# Patient Record
Sex: Male | Born: 1975
Health system: Southern US, Community
[De-identification: ages and names within clinical notes are randomized; demographics above are authoritative.]

## PROBLEM LIST (undated history)

## (undated) DIAGNOSIS — T7840XA Allergy, unspecified, initial encounter: Secondary | ICD-10-CM

## (undated) DIAGNOSIS — B019 Varicella without complication: Secondary | ICD-10-CM

## (undated) DIAGNOSIS — K219 Gastro-esophageal reflux disease without esophagitis: Secondary | ICD-10-CM

## (undated) DIAGNOSIS — K5130 Ulcerative (chronic) rectosigmoiditis without complications: Secondary | ICD-10-CM

## (undated) DIAGNOSIS — R112 Nausea with vomiting, unspecified: Principal | ICD-10-CM

## (undated) DIAGNOSIS — K5190 Ulcerative colitis, unspecified, without complications: Secondary | ICD-10-CM

## (undated) HISTORY — DX: Ulcerative colitis, unspecified, without complications: K51.90

## (undated) HISTORY — PX: COLONOSCOPY W/ BIOPSIES: SHX1374

## (undated) HISTORY — DX: Nausea with vomiting, unspecified: R11.2

## (undated) HISTORY — DX: Allergy, unspecified, initial encounter: T78.40XA

## (undated) HISTORY — DX: Gastro-esophageal reflux disease without esophagitis: K21.9

## (undated) HISTORY — DX: Ulcerative (chronic) rectosigmoiditis without complications: K51.30

## (undated) HISTORY — DX: Varicella without complication: B01.9

## (undated) HISTORY — PX: ESOPHAGOGASTRODUODENOSCOPY: SHX1529

---

## 2003-10-22 DIAGNOSIS — K5130 Ulcerative (chronic) rectosigmoiditis without complications: Secondary | ICD-10-CM | POA: Insufficient documentation

## 2009-02-26 ENCOUNTER — Encounter: Admission: RE | Admit: 2009-02-26 | Discharge: 2009-02-26 | Payer: Self-pay | Admitting: *Deleted

## 2010-08-03 ENCOUNTER — Emergency Department (HOSPITAL_COMMUNITY): Payer: BC Managed Care – PPO

## 2010-08-03 ENCOUNTER — Emergency Department (HOSPITAL_COMMUNITY)
Admission: EM | Admit: 2010-08-03 | Discharge: 2010-08-03 | Disposition: A | Discharge status: Home / Self Care | Payer: BC Managed Care – PPO | Attending: Emergency Medicine | Admitting: Emergency Medicine

## 2010-08-03 DIAGNOSIS — M542 Cervicalgia: Secondary | ICD-10-CM | POA: Insufficient documentation

## 2010-08-03 DIAGNOSIS — R079 Chest pain, unspecified: Secondary | ICD-10-CM | POA: Insufficient documentation

## 2010-08-03 LAB — D-DIMER, QUANTITATIVE

## 2010-08-03 LAB — DIFFERENTIAL
Basophils Absolute: 0 10*3/uL (ref 0.0–0.1)
Eosinophils Absolute: 0.1 10*3/uL (ref 0.0–0.7)
Lymphocytes Relative: 35 % (ref 12–46)
Lymphs Abs: 2.9 10*3/uL (ref 0.7–4.0)
Neutrophils Relative %: 56 % (ref 43–77)

## 2010-08-03 LAB — CBC
HCT: 44.9 % (ref 39.0–52.0)
MCV: 87.4 fL (ref 78.0–100.0)
Platelets: 207 10*3/uL (ref 150–400)
RBC: 5.14 MIL/uL (ref 4.22–5.81)
RDW: 12 % (ref 11.5–15.5)
WBC: 8.4 10*3/uL (ref 4.0–10.5)

## 2010-08-03 LAB — BASIC METABOLIC PANEL
CO2: 30 mEq/L (ref 19–32)
Chloride: 100 mEq/L (ref 96–112)
Creatinine, Ser: 0.88 mg/dL (ref 0.4–1.5)
Potassium: 3.7 mEq/L (ref 3.5–5.1)

## 2010-08-03 LAB — CK TOTAL AND CKMB (NOT AT ARMC)
CK, MB: 0.9 ng/mL (ref 0.3–4.0)
Relative Index: 0.9 (ref 0.0–2.5)
Total CK: 104 U/L (ref 7–232)

## 2010-08-03 LAB — TROPONIN I

## 2010-10-22 ENCOUNTER — Ambulatory Visit (INDEPENDENT_AMBULATORY_CARE_PROVIDER_SITE_OTHER): Payer: BC Managed Care – PPO | Admitting: Family Medicine

## 2010-10-22 ENCOUNTER — Encounter: Payer: Self-pay | Admitting: Family Medicine

## 2010-10-22 DIAGNOSIS — K219 Gastro-esophageal reflux disease without esophagitis: Secondary | ICD-10-CM

## 2010-10-22 DIAGNOSIS — L678 Other hair color and hair shaft abnormalities: Secondary | ICD-10-CM

## 2010-10-22 DIAGNOSIS — L738 Other specified follicular disorders: Secondary | ICD-10-CM

## 2010-10-22 DIAGNOSIS — K5190 Ulcerative colitis, unspecified, without complications: Secondary | ICD-10-CM

## 2010-10-22 DIAGNOSIS — L739 Follicular disorder, unspecified: Secondary | ICD-10-CM

## 2010-10-22 MED ORDER — MESALAMINE 400 MG PO TBEC: DELAYED_RELEASE_TABLET | ORAL | Status: DC

## 2010-10-22 MED ORDER — CEPHALEXIN 500 MG PO CAPS: 500.0000 mg | ORAL_CAPSULE | Freq: Three times a day (TID) | ORAL | Status: AC

## 2010-10-22 NOTE — Progress Notes (Signed)
  Subjective:    Patient ID: Ricky Warren, male    DOB: Aug 16, 1975, 35 y.o.   MRN: [id number]  HPI New patient to establish care.  History of ulcer colitis diagnosed several years ago. Colonoscopy 2010. No recent symptoms. Takes Asacol 400 mg 3 pills twice daily. Good appetite and weight stable. Also history of GERD with reflux esophagitis has been taking generic Prevacid for several years. He is not sure if this is 15 or 30 mg dose. GERD symptoms well controlled.  No prior surgeries. No known drug allergies. Family history unrevealing. Patient is single. Works as a Engineer, manufacturing to start a Engineer, manufacturing. No alcohol use. Quit smoking 10 years ago.  Acute problem of one week history of nonpruritic nonpainful follicular rash mostly on the trauma. No hot tub use.  Small papules that have pustular center. No exacerbating features. No alleviating features.  Sore throat for the past few days. No postnasal drip symptoms. No fever   Review of Systems  Constitutional: Negative for fever, activity change, appetite change and fatigue.  HENT: Positive for sore throat. Negative for ear pain, congestion and trouble swallowing.   Eyes: Negative for pain and visual disturbance.  Respiratory: Negative for cough, shortness of breath and wheezing.   Cardiovascular: Negative for chest pain and palpitations.  Gastrointestinal: Negative for nausea, vomiting, abdominal pain, diarrhea, constipation, blood in stool, abdominal distention and rectal pain.  Genitourinary: Negative for dysuria, hematuria and testicular pain.  Musculoskeletal: Negative for joint swelling and arthralgias.  Skin: Positive for rash.  Neurological: Negative for dizziness, syncope and headaches.  Hematological: Negative for adenopathy.  Psychiatric/Behavioral: Negative for confusion and dysphoric mood.       Objective:   Physical Exam  Constitutional: He is oriented to person, place, and time. He appears well-developed  and well-nourished. No distress.  HENT:  Right Ear: External ear normal.  Left Ear: External ear normal.  Mouth/Throat: Oropharynx is clear and moist. No oropharyngeal exudate.  Neck: Neck supple.  Cardiovascular: Normal rate, regular rhythm and normal heart sounds.   No murmur heard. Pulmonary/Chest: Effort normal and breath sounds normal. No respiratory distress. He has no wheezes. He has no rales.  Lymphadenopathy:    He has no cervical adenopathy.  Neurological: He is alert and oriented to person, place, and time.  Skin: Rash noted.       Patient has follicular rash mostly on the back. Small erythematous papules with somewhat pustular center. Nontender  Psychiatric: He has a normal mood and affect. His behavior is normal.          Assessment & Plan:  #1 history of ulcerative colitis symptomatically stable. Refilled Asacol. He'll need to establish with local gastroenterologist later #2 GERD. Symptomatically stable. Call back to confirm dose of Prevacid #3 acute folliculitis of trunk. Cephalexin 500 mg 3 times a day for 10 days

## 2010-10-22 NOTE — Patient Instructions (Signed)
Consider complete physical at some point later this year.

## 2010-11-12 ENCOUNTER — Ambulatory Visit (INDEPENDENT_AMBULATORY_CARE_PROVIDER_SITE_OTHER): Payer: BC Managed Care – PPO | Admitting: Family Medicine

## 2010-11-12 ENCOUNTER — Encounter: Payer: Self-pay | Admitting: Family Medicine

## 2010-11-12 VITALS — BP 130/86 | Temp 98.7°F | Wt 200.0 lb

## 2010-11-12 DIAGNOSIS — L678 Other hair color and hair shaft abnormalities: Secondary | ICD-10-CM

## 2010-11-12 DIAGNOSIS — L739 Follicular disorder, unspecified: Secondary | ICD-10-CM

## 2010-11-12 DIAGNOSIS — L738 Other specified follicular disorders: Secondary | ICD-10-CM

## 2010-11-12 MED ORDER — DOXYCYCLINE HYCLATE 100 MG PO CAPS: 100.0000 mg | ORAL_CAPSULE | Freq: Two times a day (BID) | ORAL | Status: AC

## 2010-11-12 NOTE — Progress Notes (Signed)
  Subjective:    Patient ID: Ricky Warren, male    DOB: [birth year], 35 y.o.   MRN: [id number]  HPI Followup follicular type rash. Location is back. Tried cephalexin and Dial soap without improvement.  He has not have any involvement of extremities. No exacerbating features. No alleviating factors. No hot tub use. Previously has taken doxycycline without any adverse side effects.   Review of Systems  Constitutional: Negative for fever and chills.  Skin: Positive for rash.  Neurological: Negative for headaches.  Hematological: Negative for adenopathy.       Objective:   Physical Exam  Constitutional: He appears well-developed and well-nourished.  Cardiovascular: Normal rate and regular rhythm.   Pulmonary/Chest: Breath sounds normal. No respiratory distress. He has no wheezes. He has no rales.  Skin:       Patient has follicular type rash distributed on the back. Small erythematous papules which are about 1 mm with yellow center. Some have crusted          Assessment & Plan:  Folliculitis. Switch antibiotic to doxycycline 100 mg twice daily and if no better in 2 weeks dermatology referral

## 2010-11-12 NOTE — Patient Instructions (Signed)
Be in touch in 2 weeks if no better.  If no better by then will consider dermatology referral.

## 2010-12-06 ENCOUNTER — Encounter: Payer: Self-pay | Admitting: Family Medicine

## 2010-12-06 ENCOUNTER — Ambulatory Visit (INDEPENDENT_AMBULATORY_CARE_PROVIDER_SITE_OTHER): Payer: BC Managed Care – PPO | Admitting: Family Medicine

## 2010-12-06 DIAGNOSIS — L738 Other specified follicular disorders: Secondary | ICD-10-CM

## 2010-12-06 DIAGNOSIS — K219 Gastro-esophageal reflux disease without esophagitis: Secondary | ICD-10-CM

## 2010-12-06 DIAGNOSIS — R350 Frequency of micturition: Secondary | ICD-10-CM

## 2010-12-06 DIAGNOSIS — L678 Other hair color and hair shaft abnormalities: Secondary | ICD-10-CM

## 2010-12-06 DIAGNOSIS — L739 Follicular disorder, unspecified: Secondary | ICD-10-CM

## 2010-12-06 LAB — POCT URINALYSIS DIPSTICK
Ketones, UA: NEGATIVE
Protein, UA: NEGATIVE
Spec Grav, UA: 1.025
Urobilinogen, UA: 2
pH, UA: 5

## 2010-12-06 MED ORDER — PANTOPRAZOLE SODIUM 40 MG PO TBEC: 40.0000 mg | DELAYED_RELEASE_TABLET | Freq: Every day | ORAL | Status: DC

## 2010-12-06 NOTE — Progress Notes (Signed)
  Subjective:    Patient ID: Ricky Warren, male    DOB: [birth year], 35 y.o.   MRN: [id number]  HPI Here for followup several items as follows  History of several week of follicular rash. Initially tried Keflex and he did not see any improvement. We subsequently tried doxycycline and he had nausea and stopped after 3 days. Rash mostly located on the back. Several pustules. Using antibacterial soap.  Intermittent sore throat for one month. Has frequent reflux symptoms. Currently takes Prevacid. Previously took generic protonix which he thinks may have helped more. He has eliminated alcohol and caffeine. No dysphagia. No appetite or weight changes.  Urine frequency without burning over the past several weeks. No caffeine use. No obstructive symptoms. Denies fever or chills.  No sexual activity for about 3 years and no hx of STD.  He has a history of ulcerative colitis treated with mesalamine and symptomatically stable   Review of Systems  Constitutional: Negative for fever, chills, appetite change and unexpected weight change.  HENT: Positive for sore throat. Negative for trouble swallowing, voice change, postnasal drip, sinus pressure and ear discharge.   Gastrointestinal: Negative for abdominal pain.  Genitourinary: Positive for frequency. Negative for dysuria, hematuria and discharge.  Musculoskeletal: Negative for back pain.  Skin: Positive for rash.       Objective:   Physical Exam  Constitutional: He appears well-developed and well-nourished.  HENT:  Right Ear: External ear normal.  Left Ear: External ear normal.  Mouth/Throat: Oropharynx is clear and moist.  Neck: Neck supple.  Cardiovascular: Normal rate and regular rhythm.   Pulmonary/Chest: Effort normal and breath sounds normal. No respiratory distress. He has no wheezes. He has no rales.  Genitourinary: Rectum normal and prostate normal.  Musculoskeletal: He exhibits no edema.  Lymphadenopathy:    He has no cervical  adenopathy.  Skin:       Patient has follicular rash mostly back with several small erythematous papules and pustules. No cystic acne          Assessment & Plan:  #1 follicular rash. Intolerance to doxycycline. Poor response to cephalexin. We discussed possible dermatology referral and patient like to proceed. #2 history of GERD. Change to pantoprazole 40 mg daily. Educational sheet on bland diet given. #3 urine frequency. Check urine dipstick.  No evidence for prostatitis.  Discussed avoidance of caffeine and he uses minimal and no ETOH.

## 2010-12-06 NOTE — Patient Instructions (Signed)
Diet for GERD or PUD Nutrition therapy can help ease the discomfort of gastroesophageal reflux disease (GERD) and peptic ulcer disease (PUD).  HOME CARE INSTRUCTIONS  Eat your meals slowly, in a relaxed setting.   Eat 5 to 6 small meals per day.   If a food causes distress, stop eating it for a period of time.  FOODS TO AVOID:  Coffee, regular or decaffeinated.  Cola beverages, regular or low calorie.   Tea, regular or decaffeinated.   Pepper.   Cocoa.   High fat foods including meats.   Butter, margarine, hydrogenated oil (trans fats).  Peppermint or spearmint (if you have GERD).   Fruits and vegetables as tolerated.   Alcoholic beverages.   Nicotine (smoking or chewing). This is one of the most potent stimulants to acid production in the gastrointestinal tract.   Any food that seems to aggravate your condition.   If you have questions regarding your diet, call your caregiver's office or a registered dietitian. OTHER TIPS IF YOU HAVE GERD:  Lying flat may make symptoms worse. Keep the head of your bed raised 6 to 9 inches by using a foam wedge or blocks under the legs of the bed.   Do not lay down until 3 hours after eating a meal.   Daily physical activity may help reduce symptoms.  MAKE SURE YOU:   Understand these instructions.   Will watch your condition.   Will get help right away if you are not doing well or get worse.  Document Released: 02/24/2005 Document Re-Released: 07/13/2008 [facility] Patient Information 2011 [place].

## 2011-09-18 ENCOUNTER — Ambulatory Visit (INDEPENDENT_AMBULATORY_CARE_PROVIDER_SITE_OTHER): Payer: BC Managed Care – PPO | Admitting: Internal Medicine

## 2011-09-18 VITALS — BP 132/92 | Temp 98.6°F | Wt 233.0 lb

## 2011-09-18 DIAGNOSIS — IMO0001 Reserved for inherently not codable concepts without codable children: Secondary | ICD-10-CM

## 2011-09-18 DIAGNOSIS — R5382 Chronic fatigue, unspecified: Secondary | ICD-10-CM

## 2011-09-18 DIAGNOSIS — R5381 Other malaise: Secondary | ICD-10-CM

## 2011-09-18 DIAGNOSIS — R5383 Other fatigue: Secondary | ICD-10-CM

## 2011-09-18 DIAGNOSIS — M6281 Muscle weakness (generalized): Secondary | ICD-10-CM

## 2011-09-18 DIAGNOSIS — M791 Myalgia, unspecified site: Secondary | ICD-10-CM

## 2011-09-18 DIAGNOSIS — M2550 Pain in unspecified joint: Secondary | ICD-10-CM

## 2011-09-18 LAB — HEPATIC FUNCTION PANEL
ALT: 20 U/L (ref 0–53)
AST: 20 U/L (ref 0–37)
Albumin: 4.5 g/dL (ref 3.5–5.2)
Alkaline Phosphatase: 69 U/L (ref 39–117)
Bilirubin, Direct: 0 mg/dL (ref 0.0–0.3)
Total Bilirubin: 0.9 mg/dL (ref 0.3–1.2)
Total Protein: 8.1 g/dL (ref 6.0–8.3)

## 2011-09-18 LAB — CBC WITH DIFFERENTIAL/PLATELET
Basophils Absolute: 0 10*3/uL (ref 0.0–0.1)
HCT: 47.2 % (ref 39.0–52.0)
Hemoglobin: 15.8 g/dL (ref 13.0–17.0)
Lymphs Abs: 2.2 10*3/uL (ref 0.7–4.0)
MCHC: 33.5 g/dL (ref 30.0–36.0)
MCV: 91.3 fl (ref 78.0–100.0)
Monocytes Absolute: 0.4 10*3/uL (ref 0.1–1.0)
Monocytes Relative: 6 % (ref 3.0–12.0)
Neutro Abs: 3.8 10*3/uL (ref 1.4–7.7)
Platelets: 211 10*3/uL (ref 150.0–400.0)
RDW: 12.9 % (ref 11.5–14.6)

## 2011-09-18 LAB — CK: Total CK: 165 U/L (ref 7–232)

## 2011-09-18 LAB — TSH: TSH: 1.57 u[IU]/mL (ref 0.35–5.50)

## 2011-09-18 LAB — SEDIMENTATION RATE: Sed Rate: 17 mm/hr (ref 0–22)

## 2011-09-19 ENCOUNTER — Encounter: Payer: Self-pay | Admitting: Internal Medicine

## 2011-09-19 DIAGNOSIS — R5382 Chronic fatigue, unspecified: Secondary | ICD-10-CM | POA: Insufficient documentation

## 2011-09-19 DIAGNOSIS — M6281 Muscle weakness (generalized): Secondary | ICD-10-CM | POA: Insufficient documentation

## 2011-09-19 DIAGNOSIS — M2550 Pain in unspecified joint: Secondary | ICD-10-CM | POA: Insufficient documentation

## 2011-09-19 NOTE — Assessment & Plan Note (Signed)
Patient has symptoms consistent with chronic fatigue. Check CBC, LFTs and thyroid functions.

## 2011-09-19 NOTE — Progress Notes (Signed)
Subjective:    Patient ID: Ricky Warren, male    DOB: [birth year], 36 y.o.   MRN: [id number]  HPI  36 year old white male with history of mild ulcerative colitis complains of chronic fatigue, muscle pain, joint pain and concentration issues. His symptoms have been ongoing for the last 3 years. He is concerned that he may have Lyme's disease. 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 He describes 15-20 tick bites over a three-month time period. He never experienced classic bulls eye rash. His symptoms started about one month after tick exposure and he was seen by a primary care physician 6 months later. He was tested for Lyme disease and also rheumatoid arthritis and testing was normal. Orthopedic specialists saw him in January of 2011 and tried physical therapy without any improvement. He received physical therapy for chronic neck and back pain.  He continues to have generalized muscle weakness and exhaustion. He reports poor physical stamina. He has noticed periodic fasciculations of muscles in his upper extremities and lower extremities.   Review of Systems Negative for any gastrointestinal complaints, negative for focal neurologic changes Negative for chest pain or shortness of breath  Past Medical History  Diagnosis Date  . GERD (gastroesophageal reflux disease)   . Allergy   . Chicken pox   . Ulcerative colitis     History   Social History  . Marital Status: Single    Spouse Name: N/A    Number of Children: N/A  . Years of Education: N/A   Occupational History  . Not on file.   Social History Main Topics  . Smoking status: Former Smoker -- 1.0 packs/day for 9 years    Types: Cigarettes    Quit date: 10/21/2001  . Smokeless tobacco: Not on file  . Alcohol Use: Not on file  . Drug Use: Not on file  . Sexually Active: Not on file   Other Topics Concern  . Not on file   Social History Narrative  . No  narrative on file    No past surgical history on file.  No family history on file.  Allergies  Allergen Reactions  . Doxycycline     GI upset, nausea    Current Outpatient Prescriptions on File Prior to Visit  Medication Sig Dispense Refill  . mesalamine (ASACOL) 400 MG EC tablet Take 400 mg by mouth 2 (two) times daily.      . pantoprazole (PROTONIX) 40 MG tablet Take 1 tablet (40 mg total) by mouth daily.  30 tablet  11    BP 132/92  Temp 98.6 F (37 C) (Oral)  Wt 233 lb (105.688 kg)       Objective:   Physical Exam  Constitutional: He is oriented to person, place, and time. He appears well-developed and well-nourished. No distress.  HENT:  Head: Normocephalic and atraumatic.  Eyes: EOM are normal. Pupils are equal, round, and reactive to light.  Neck: Neck supple.  Cardiovascular: Normal rate, regular rhythm and normal heart sounds.   No murmur heard. Pulmonary/Chest: Effort normal and breath sounds normal. He has no wheezes.  Abdominal: Soft. Bowel sounds are normal. He exhibits no mass. There is no tenderness.  Musculoskeletal: Normal range of motion. He exhibits no edema.       No swelling or redness of joints  Lymphadenopathy:    He has no cervical adenopathy.  Neurological: He is alert and oriented to person, place, and time. No  cranial nerve deficit.  Skin: No rash noted.  Psychiatric: He has a normal mood and affect. His behavior is normal.     Assessment & Plan:

## 2011-09-19 NOTE — Assessment & Plan Note (Signed)
Patient has unexplained polyarticular joint pains. He is concerned that symptoms are related to previous tick bite exposure in 2010. His previous antibody testing was -6 months after exposure. I doubt his symptoms are related from Lyme's disease. He was also treated with over a month of doxycycline for acne of his back. There is no improvement in his symptoms.  Repeat Lyme antibodies.  Check ANA, rheumatoid factor and sedimentation rate.  It is unclear whether patient's symptoms related to his history of ulcerative colitis.

## 2011-09-19 NOTE — Assessment & Plan Note (Signed)
Patient complains of generalized muscle weakness and poor physical stamina. He also describes intermittent muscle fasciculations in his upper and lower extremities. Check CPK and serum aldoase.  I doubt his symptoms are related to Lyme's disease. We discussed possibility of other viral infections that may cause chronic muscle disease.

## 2011-09-20 LAB — ALDOLASE: Aldolase: 4 U/L (ref <=8.1)

## 2011-12-26 ENCOUNTER — Other Ambulatory Visit: Payer: Self-pay | Admitting: *Deleted

## 2011-12-26 MED ORDER — PANTOPRAZOLE SODIUM 40 MG PO TBEC: 40.0000 mg | DELAYED_RELEASE_TABLET | Freq: Every day | ORAL | Status: DC

## 2012-04-12 ENCOUNTER — Other Ambulatory Visit: Payer: Self-pay | Admitting: Family Medicine

## 2012-06-17 ENCOUNTER — Encounter (HOSPITAL_COMMUNITY): Payer: Self-pay | Admitting: Emergency Medicine

## 2012-06-17 ENCOUNTER — Emergency Department (HOSPITAL_COMMUNITY): Payer: BC Managed Care – PPO

## 2012-06-17 ENCOUNTER — Emergency Department (HOSPITAL_COMMUNITY)
Admission: EM | Admit: 2012-06-17 | Discharge: 2012-06-17 | Disposition: A | Discharge status: Home / Self Care | Payer: BC Managed Care – PPO | Attending: Emergency Medicine | Admitting: Emergency Medicine

## 2012-06-17 DIAGNOSIS — R5383 Other fatigue: Secondary | ICD-10-CM | POA: Insufficient documentation

## 2012-06-17 DIAGNOSIS — R61 Generalized hyperhidrosis: Secondary | ICD-10-CM | POA: Insufficient documentation

## 2012-06-17 DIAGNOSIS — K5190 Ulcerative colitis, unspecified, without complications: Secondary | ICD-10-CM

## 2012-06-17 DIAGNOSIS — K219 Gastro-esophageal reflux disease without esophagitis: Secondary | ICD-10-CM | POA: Insufficient documentation

## 2012-06-17 DIAGNOSIS — Z87891 Personal history of nicotine dependence: Secondary | ICD-10-CM | POA: Insufficient documentation

## 2012-06-17 DIAGNOSIS — R5382 Chronic fatigue, unspecified: Secondary | ICD-10-CM

## 2012-06-17 DIAGNOSIS — R6883 Chills (without fever): Secondary | ICD-10-CM | POA: Insufficient documentation

## 2012-06-17 DIAGNOSIS — R197 Diarrhea, unspecified: Secondary | ICD-10-CM | POA: Insufficient documentation

## 2012-06-17 DIAGNOSIS — R5381 Other malaise: Secondary | ICD-10-CM | POA: Insufficient documentation

## 2012-06-17 DIAGNOSIS — A084 Viral intestinal infection, unspecified: Secondary | ICD-10-CM

## 2012-06-17 DIAGNOSIS — A088 Other specified intestinal infections: Secondary | ICD-10-CM | POA: Insufficient documentation

## 2012-06-17 DIAGNOSIS — R112 Nausea with vomiting, unspecified: Secondary | ICD-10-CM | POA: Insufficient documentation

## 2012-06-17 DIAGNOSIS — Z79899 Other long term (current) drug therapy: Secondary | ICD-10-CM | POA: Insufficient documentation

## 2012-06-17 DIAGNOSIS — Z8619 Personal history of other infectious and parasitic diseases: Secondary | ICD-10-CM | POA: Insufficient documentation

## 2012-06-17 DIAGNOSIS — R1013 Epigastric pain: Secondary | ICD-10-CM | POA: Insufficient documentation

## 2012-06-17 LAB — URINALYSIS, ROUTINE W REFLEX MICROSCOPIC
Bilirubin Urine: NEGATIVE
Ketones, ur: NEGATIVE mg/dL
Nitrite: NEGATIVE
Protein, ur: NEGATIVE mg/dL
Urobilinogen, UA: 0.2 mg/dL (ref 0.0–1.0)

## 2012-06-17 LAB — BASIC METABOLIC PANEL
BUN: 7 mg/dL (ref 6–23)
Calcium: 9.5 mg/dL (ref 8.4–10.5)
Creatinine, Ser: 0.9 mg/dL (ref 0.50–1.35)
GFR calc Af Amer: >90 mL/min (ref >90)
GFR calc non Af Amer: >90 mL/min (ref >90)

## 2012-06-17 LAB — CBC WITH DIFFERENTIAL/PLATELET
Basophils Absolute: 0 10*3/uL (ref 0.0–0.1)
Basophils Relative: 0 % (ref 0–1)
Eosinophils Absolute: 0.6 10*3/uL (ref 0.0–0.7)
HCT: 47.1 % (ref 39.0–52.0)
Hemoglobin: 16.3 g/dL (ref 13.0–17.0)
MCH: 30.2 pg (ref 26.0–34.0)
MCHC: 34.6 g/dL (ref 30.0–36.0)
Monocytes Absolute: 0.6 10*3/uL (ref 0.1–1.0)
Monocytes Relative: 5 % (ref 3–12)
Neutrophils Relative %: 67 % (ref 43–77)
RDW: 12.7 % (ref 11.5–15.5)

## 2012-06-17 LAB — LIPASE, BLOOD: Lipase: 78 U/L — ABNORMAL HIGH (ref 11–59)

## 2012-06-17 MED ORDER — LORAZEPAM 2 MG/ML IJ SOLN: 1.0000 mg | Freq: Once | INTRAMUSCULAR | Status: DC

## 2012-06-17 MED ORDER — ONDANSETRON HCL 4 MG/2ML IJ SOLN
4.0000 mg | Freq: Once | INTRAMUSCULAR | Status: AC
  Administered 2012-06-17: 4 mg via INTRAVENOUS
  Filled 2012-06-17: qty 2

## 2012-06-17 MED ORDER — IOHEXOL 300 MG/ML  SOLN
50.0000 mL | Freq: Once | INTRAMUSCULAR | Status: AC | PRN
  Administered 2012-06-17: 50 mL via ORAL

## 2012-06-17 MED ORDER — HYDROMORPHONE HCL PF 1 MG/ML IJ SOLN
1.0000 mg | Freq: Once | INTRAMUSCULAR | Status: AC
  Administered 2012-06-17: 1 mg via INTRAVENOUS
  Filled 2012-06-17: qty 1

## 2012-06-17 MED ORDER — PROMETHAZINE HCL 25 MG/ML IJ SOLN
25.0000 mg | Freq: Once | INTRAMUSCULAR | Status: AC
  Administered 2012-06-17: 25 mg via INTRAVENOUS
  Filled 2012-06-17: qty 1

## 2012-06-17 MED ORDER — IOHEXOL 300 MG/ML  SOLN
100.0000 mL | Freq: Once | INTRAMUSCULAR | Status: AC | PRN
  Administered 2012-06-17: 100 mL via INTRAVENOUS

## 2012-06-17 MED ORDER — HYDROCODONE-ACETAMINOPHEN 5-325 MG PO TABS: 1.0000 | ORAL_TABLET | ORAL | Status: DC | PRN

## 2012-06-17 MED ORDER — MORPHINE SULFATE 4 MG/ML IJ SOLN
4.0000 mg | Freq: Once | INTRAMUSCULAR | Status: AC
  Administered 2012-06-17: 4 mg via INTRAVENOUS
  Filled 2012-06-17: qty 1

## 2012-06-17 MED ORDER — SODIUM CHLORIDE 0.9 % IV BOLUS (SEPSIS)
1000.0000 mL | Freq: Once | INTRAVENOUS | Status: AC
  Administered 2012-06-17: 1000 mL via INTRAVENOUS

## 2012-06-17 MED ORDER — ONDANSETRON 8 MG PO TBDP: ORAL_TABLET | ORAL | Status: DC

## 2012-06-17 MED ORDER — METOCLOPRAMIDE HCL 5 MG/ML IJ SOLN
10.0000 mg | Freq: Once | INTRAMUSCULAR | Status: AC
  Administered 2012-06-17: 10 mg via INTRAVENOUS
  Filled 2012-06-17: qty 2

## 2012-06-17 NOTE — ED Provider Notes (Signed)
Medical screening examination/treatment/procedure(s) were performed by non-physician practitioner and as supervising physician I was immediately available for consultation/collaboration.  Orpah Greek, MD 06/17/12 2245

## 2012-06-17 NOTE — ED Notes (Signed)
Patient reports he feels better.  Walked to bathroom for urine sample.

## 2012-06-17 NOTE — ED Notes (Signed)
Patient still c/o pain and nausea.  PA notified.  Rates abdominal pain as an 8.  Feels like it is all over his abdomen not one particular area.

## 2012-06-17 NOTE — ED Notes (Signed)
Pt's  POCT Occult Blood Stool resulted POS.

## 2012-06-17 NOTE — ED Notes (Signed)
PA informed of low BP.  Order 3rd bag of fluid wide open.

## 2012-06-17 NOTE — ED Provider Notes (Signed)
History     CSN: [id number]  Arrival date & time 06/17/12  1620   First MD Initiated Contact with Patient 06/17/12 1628      Chief Complaint  Patient presents with  . Emesis    (Consider location/radiation/quality/duration/timing/severity/associated sxs/prior treatment) Patient is a 37 y.o. male presenting with vomiting. The history is provided by the patient and medical records. No language interpreter was used.  Emesis Severity:  Moderate Duration:  5 hours Timing:  Intermittent Quality:  Stomach contents (NBNB) Feeding tolerance: nothing. Progression:  Unchanged Chronicity:  New Recent urination:  Normal Context: not post-tussive and not self-induced   Relieved by:  Nothing Worsened by:  Nothing tried Ineffective treatments:  None tried Associated symptoms: abdominal pain (epigastric), chills and diarrhea   Associated symptoms: no arthralgias, no cough, no fever, no headaches, no myalgias, no sore throat and no URI   Risk factors: no alcohol use, no diabetes, no sick contacts, no suspect food intake and no travel to endemic areas     Mounir Skipper is a 37 y.o. male  with a hx of UC and GERD for which he takes Asacol and Protonix presents to the Emergency Department complaining of acute, persistent, progressively worsening epigastric abdominal pain associated with N/V/D onset 12 noon today when he woke up. Pt states this happens x1 per year and he is usually admitted, but this does not feel like a UC flare. He denies bloody emesis or diarrhea. Associated symptoms include diaphoresis, pallor, chills and rigors.  nothing makes it better and nothing makes it worse.  Pt denies measured fever, headache, neck pain, rashes, chest pain, SOB, oral sores, syncope, dysuria, hematuria, bloody stools.  Emesis is NBNB.     Past Medical History  Diagnosis Date  . GERD (gastroesophageal reflux disease)   . Allergy   . Chicken pox   . Ulcerative colitis     History reviewed. No  pertinent past surgical history.  History reviewed. No pertinent family history.  History  Substance Use Topics  . Smoking status: Former Smoker -- 1.00 packs/day for 9 years    Types: Cigarettes    Quit date: 10/21/2001  . Smokeless tobacco: Never Used  . Alcohol Use: No      Review of Systems  Constitutional: Positive for chills and diaphoresis. Negative for fever, appetite change, fatigue and unexpected weight change.  HENT: Negative for sore throat, mouth sores, trouble swallowing, neck pain and neck stiffness.   Respiratory: Negative for cough, chest tightness, shortness of breath, wheezing and stridor.   Cardiovascular: Negative for chest pain and palpitations.  Gastrointestinal: Positive for nausea, vomiting, abdominal pain (epigastric) and diarrhea. Negative for constipation, blood in stool, abdominal distention and rectal pain.  Genitourinary: Negative for dysuria, urgency, frequency, hematuria, flank pain and difficulty urinating.  Musculoskeletal: Negative for myalgias, back pain and arthralgias.  Skin: Negative for rash.  Neurological: Negative for weakness and headaches.  Hematological: Negative for adenopathy.  Psychiatric/Behavioral: Negative for confusion.  All other systems reviewed and are negative.    Allergies  Doxycycline  Home Medications   Current Outpatient Rx  Name  Route  Sig  Dispense  Refill  . mesalamine (ASACOL) 400 MG EC tablet   Oral   Take 400 mg by mouth 2 (two) times daily.         . pantoprazole (PROTONIX) 40 MG tablet   Oral   Take 40 mg by mouth daily.         Marland Kitchen HYDROcodone-acetaminophen (NORCO/VICODIN) 5-325  MG per tablet   Oral   Take 1-2 tablets by mouth every 4 (four) hours as needed for pain.   10 tablet   0   . ondansetron (ZOFRAN ODT) 8 MG disintegrating tablet      71m ODT q4 hours prn nausea   4 tablet   0     BP 109/68  Pulse 71  Temp(Src) 98.7 F (37.1 C) (Oral)  Resp 20  SpO2 99%  Physical Exam   Nursing note and vitals reviewed. Constitutional: He is oriented to person, place, and time. He appears well-developed and well-nourished.  HENT:  Head: Normocephalic and atraumatic.  Mouth/Throat: Oropharynx is clear and moist.  Eyes: Conjunctivae are normal. Pupils are equal, round, and reactive to light. No scleral icterus.  Neck: Normal range of motion.  Cardiovascular: Normal rate, regular rhythm, normal heart sounds and intact distal pulses.  Exam reveals no gallop and no friction rub.   No murmur heard. Pulmonary/Chest: Effort normal and breath sounds normal. No respiratory distress. He has no wheezes. He has no rales. He exhibits no tenderness.  Abdominal: Soft. Normal appearance and bowel sounds are normal. He exhibits no distension and no mass. There is tenderness in the epigastric area. There is no rigidity, no rebound, no guarding, no CVA tenderness, no tenderness at McBurney's point and negative Murphy's sign.  Genitourinary: Rectal exam shows no external hemorrhoid, no internal hemorrhoid, no fissure, no mass, no tenderness and anal tone normal. Guaiac positive stool. Prostate is not enlarged and not tender.  Musculoskeletal: He exhibits no edema and no tenderness.  Lymphadenopathy:    He has no cervical adenopathy.  Neurological: He is alert and oriented to person, place, and time. He exhibits normal muscle tone. Coordination normal.  Skin: Skin is warm. He is diaphoretic. There is pallor.  Psychiatric: He has a normal mood and affect.    ED Course  Procedures (including critical care time)  Labs Reviewed  CBC WITH DIFFERENTIAL - Abnormal; Notable for the following:    WBC 13.8 (*)    Neutro Abs 9.2 (*)    All other components within normal limits  BASIC METABOLIC PANEL - Abnormal; Notable for the following:    Glucose, Bld 137 (*)    All other components within normal limits  URINALYSIS, ROUTINE W REFLEX MICROSCOPIC - Abnormal; Notable for the following:    Specific  Gravity, Urine >1.046 (*)    All other components within normal limits  LIPASE, BLOOD - Abnormal; Notable for the following:    Lipase 78 (*)    All other components within normal limits  OCCULT BLOOD, POC DEVICE - Abnormal; Notable for the following:    Fecal Occult Bld POSITIVE (*)    All other components within normal limits   Ct Abdomen Pelvis W Contrast  06/17/2012  *RADIOLOGY REPORT*  Clinical Data: Abdominal pain, nausea, vomiting  CT ABDOMEN AND PELVIS WITH CONTRAST  Technique:  Multidetector CT imaging of the abdomen and pelvis was performed following the standard protocol during bolus administration of intravenous contrast.  Contrast: 1061mOMNIPAQUE IOHEXOL 300 MG/ML  SOLN, 5068mMNIPAQUE IOHEXOL 300 MG/ML  SOLN  Comparison: None.  Findings: Lung bases are clear.  Liver, gallbladder, kidneys, adrenal glands, spleen, and pancreas are normal.  No lymphadenopathy, free air, or free fluid.  The stomach and duodenum are grossly normal.  The appendix is normal.  No bowel wall thickening or focal segmental dilatation.  Bladder is decompressed but unremarkable. No pelvic lymphadenopathy or free  fluid.  No acute osseous abnormality.  IMPRESSION: No acute intra-abdominal or pelvic pathology.   Original Report Authenticated By: Conchita Paris, M.D.      1. Gastroenteritis and colitis, viral   2. Ulcerative colitis   3. GERD (gastroesophageal reflux disease)   4. Chronic fatigue       MDM  Greydon Betke presents with acute onset epigastric pain and N/V/D which occurred together.  Pt stats Hx of UC for which he takes Asacol and protonix.  This does not appear to be a UC flare.  He has mild tenderness in the epigastrum, but no rebound or peritoneal signs.  Will give pain medication, antiemetics, fluid bolus and re-evaluate.  Pt with persistent emesis after redosing of zofran and reglan.  Will give phenergan and redose dilaudid.  CBC with mild leukocytosis, no anemia.  Lipase 78 and expected  for persistent vomiting.  BMP unremarkable.    CT abd/pelvis is unremarkable.  Pt with positive fecal occult, but no anemia or gross blood on rectal exam.    UA without evidence of UTI.  Pt pain under control and states he feels much better. He states he has episodes like this 1x/year in the spring and when re-evaluated by GI they are unable to determine the cause.  Pt states last eval by GI showed that UC was under control and there were no lesions.    Patient is nontoxic, nonseptic appearing, in no apparent distress.  Patient's pain and other symptoms adequately managed in emergency department.  Fluid bolus given.  Labs, imaging and vitals reviewed.  Patient does not meet the SIRS or Sepsis criteria.  On repeat exam patient does not have a surgical abdomin and there are no peritoneal signs.  No indication of appendicitis, bowel obstruction, bowel perforation, cholecystitis, diverticulitis.  Patient discharged home with symptomatic treatment and given strict instructions for follow-up with their primary care physician and GI.  I have also discussed reasons to return immediately to the ER.  Patient expresses understanding and agrees with plan.           Jarrett Soho Kayce Betty, PA-C 06/17/12 2234

## 2012-06-17 NOTE — ED Notes (Addendum)
Pt states he woke up today around noon with abdominal pain that led to vomiting.

## 2012-06-17 NOTE — ED Notes (Signed)
Patient states still unable to give urine sample.

## 2012-06-17 NOTE — ED Notes (Signed)
Patient unable to provide urine sample at this time. Urinal at bedside.

## 2012-06-17 NOTE — ED Notes (Signed)
Patient still unable to urinate.  PA aware.  POC stool sent.

## 2012-07-09 ENCOUNTER — Other Ambulatory Visit: Payer: Self-pay | Admitting: Family Medicine

## 2012-07-12 ENCOUNTER — Other Ambulatory Visit: Payer: Self-pay | Admitting: Family Medicine

## 2012-07-13 ENCOUNTER — Other Ambulatory Visit: Payer: Self-pay | Admitting: *Deleted

## 2012-07-13 MED ORDER — MESALAMINE 400 MG PO TBEC: DELAYED_RELEASE_TABLET | ORAL | Status: DC

## 2012-07-14 ENCOUNTER — Telehealth: Payer: Self-pay | Admitting: *Deleted

## 2012-07-14 MED ORDER — MESALAMINE 400 MG PO CPDR: 400.0000 mg | DELAYED_RELEASE_CAPSULE | Freq: Every day | ORAL | Status: DC

## 2012-07-14 NOTE — Telephone Encounter (Signed)
Okay to substitute

## 2012-07-14 NOTE — Telephone Encounter (Signed)
Pt requesting refill of Asacol 400 mg, sig: take 3 tabs BID.  We received a fax back from [facility] to inform Asacol not available, asking to substitute with [facility].

## 2012-07-19 ENCOUNTER — Encounter: Payer: Self-pay | Admitting: Family Medicine

## 2012-07-19 ENCOUNTER — Ambulatory Visit (INDEPENDENT_AMBULATORY_CARE_PROVIDER_SITE_OTHER): Payer: BC Managed Care – PPO | Admitting: Family Medicine

## 2012-07-19 VITALS — BP 140/60 | Temp 99.8°F | Wt 232.0 lb

## 2012-07-19 DIAGNOSIS — M545 Low back pain, unspecified: Secondary | ICD-10-CM

## 2012-07-19 DIAGNOSIS — R1084 Generalized abdominal pain: Secondary | ICD-10-CM

## 2012-07-19 DIAGNOSIS — R109 Unspecified abdominal pain: Secondary | ICD-10-CM

## 2012-07-19 LAB — CBC WITH DIFFERENTIAL/PLATELET
Basophils Relative: 0.5 % (ref 0.0–3.0)
Eosinophils Absolute: 0.4 10*3/uL (ref 0.0–0.7)
Eosinophils Relative: 4.3 % (ref 0.0–5.0)
Lymphocytes Relative: 43.6 % (ref 12.0–46.0)
MCHC: 34.6 g/dL (ref 30.0–36.0)
Neutrophils Relative %: 46 % (ref 43.0–77.0)
Platelets: 235 10*3/uL (ref 150.0–400.0)
RBC: 5.05 Mil/uL (ref 4.22–5.81)
WBC: 9.8 10*3/uL (ref 4.5–10.5)

## 2012-07-19 LAB — POCT URINALYSIS DIPSTICK
Glucose, UA: NEGATIVE
Leukocytes, UA: NEGATIVE
Protein, UA: NEGATIVE
Spec Grav, UA: 1.025
Urobilinogen, UA: 0.2

## 2012-07-19 NOTE — Patient Instructions (Addendum)
Follow up promptly for any fever, vomiting, or worsening pain.

## 2012-07-19 NOTE — Progress Notes (Signed)
  Subjective:    Patient ID: Ricky Warren, male    DOB: Dec 21, 1975, 37 y.o.   MRN: [id number]  HPI  2 weeks hx of lumbar back pain radiating anterior. Symptoms are bilateral.  Dull ache 6/10. Pains are off an on.   No exacerbating.  No alleviating. Has taken no meds. No known injury.  Weight is stable.  No fever, chills, or dysuria.   Denies radiculopathy symptoms. No lower extremity numbness or weakness.  Has hx of ulcerative colitis   On mesalamine. Colonoscopy last year.  Denies any current diarrhea or bloody stools Patient had ER visit with nausea vomiting and diarrhea one month ago. He had fairly extensive workup. Elevated white count. CT abdomen and pelvis unremarkable Those symptoms have resolved. He has not yet establish with local gastroenterologist.  Past Medical History  Diagnosis Date  . GERD (gastroesophageal reflux disease)   . Allergy   . Chicken pox   . Ulcerative colitis    No past surgical history on file.  reports that he quit smoking about 10 years ago. His smoking use included Cigarettes. He has a 9 pack-year smoking history. He has never used smokeless tobacco. He reports that he does not drink alcohol. His drug history is not on file. family history is not on file. Allergies  Allergen Reactions  . Doxycycline     GI upset, nausea     Review of Systems  Constitutional: Negative for fever, chills, activity change and appetite change.  Respiratory: Negative for cough.   Cardiovascular: Negative for chest pain.  Gastrointestinal: Positive for abdominal pain. Negative for nausea, vomiting, diarrhea, constipation, blood in stool and abdominal distention.  Genitourinary: Negative for dysuria and hematuria.  Neurological: Negative for weakness.  Hematological: Negative for adenopathy.       Objective:   Physical Exam  Constitutional: He appears well-developed and well-nourished.  Neck: Neck supple. No thyromegaly present.  Cardiovascular: Normal  rate and regular rhythm.   Pulmonary/Chest: Effort normal and breath sounds normal. No respiratory distress. He has no wheezes. He has no rales.  Abdominal: Soft. Bowel sounds are normal. He exhibits no distension and no mass. There is no tenderness. There is no rebound and no guarding.  Musculoskeletal: He exhibits no edema.  Straight leg raises are negative  Neurological: He has normal reflexes.  Full-strength lower extremities  Skin: No rash noted.          Assessment & Plan:  Patient seen with vague lumbar back pain which is nonlocalizing and complaints of some diffuse abdominal pains lower abdomen. No associated vomiting or diarrhea. He has history of ulcerative colitis which apparently has been fairly well controlled.  Nonfocal exam. Repeat CBC and urinalysis. Patient needs to establish with local gastroenterologist regarding his ulcerative colitis

## 2012-07-19 NOTE — Progress Notes (Signed)
Quick Note:  Pt informed on VM ______

## 2012-08-06 ENCOUNTER — Encounter (HOSPITAL_COMMUNITY): Payer: Self-pay | Admitting: *Deleted

## 2012-08-06 ENCOUNTER — Observation Stay (HOSPITAL_COMMUNITY)
Admission: EM | Admit: 2012-08-06 | Discharge: 2012-08-07 | DRG: 182 | Discharge status: Against Medical Advice | Payer: BC Managed Care – PPO | Attending: Internal Medicine | Admitting: Internal Medicine

## 2012-08-06 DIAGNOSIS — R112 Nausea with vomiting, unspecified: Secondary | ICD-10-CM

## 2012-08-06 DIAGNOSIS — R1115 Cyclical vomiting syndrome unrelated to migraine: Principal | ICD-10-CM | POA: Insufficient documentation

## 2012-08-06 DIAGNOSIS — K5130 Ulcerative (chronic) rectosigmoiditis without complications: Secondary | ICD-10-CM | POA: Diagnosis present

## 2012-08-06 DIAGNOSIS — E872 Acidosis, unspecified: Secondary | ICD-10-CM | POA: Insufficient documentation

## 2012-08-06 DIAGNOSIS — Z79899 Other long term (current) drug therapy: Secondary | ICD-10-CM | POA: Insufficient documentation

## 2012-08-06 DIAGNOSIS — D72829 Elevated white blood cell count, unspecified: Secondary | ICD-10-CM | POA: Diagnosis present

## 2012-08-06 DIAGNOSIS — R109 Unspecified abdominal pain: Secondary | ICD-10-CM | POA: Diagnosis present

## 2012-08-06 DIAGNOSIS — R1013 Epigastric pain: Secondary | ICD-10-CM | POA: Insufficient documentation

## 2012-08-06 DIAGNOSIS — K5190 Ulcerative colitis, unspecified, without complications: Secondary | ICD-10-CM | POA: Insufficient documentation

## 2012-08-06 DIAGNOSIS — K219 Gastro-esophageal reflux disease without esophagitis: Secondary | ICD-10-CM

## 2012-08-06 LAB — COMPREHENSIVE METABOLIC PANEL
ALT: 15 U/L (ref 0–53)
AST: 21 U/L (ref 0–37)
Albumin: 4.6 g/dL (ref 3.5–5.2)
Alkaline Phosphatase: 93 U/L (ref 39–117)
BUN: 9 mg/dL (ref 6–23)
CO2: 17 mEq/L — ABNORMAL LOW (ref 19–32)
Calcium: 9.9 mg/dL (ref 8.4–10.5)
Chloride: 101 mEq/L (ref 96–112)
Creatinine, Ser: 0.85 mg/dL (ref 0.50–1.35)
GFR calc Af Amer: >90 mL/min (ref >90)
GFR calc non Af Amer: >90 mL/min (ref >90)
Glucose, Bld: 130 mg/dL — ABNORMAL HIGH (ref 70–99)
Potassium: 3.5 mEq/L (ref 3.5–5.1)
Sodium: 138 mEq/L (ref 135–145)
Total Bilirubin: 0.2 mg/dL — ABNORMAL LOW (ref 0.3–1.2)
Total Protein: 8.2 g/dL (ref 6.0–8.3)

## 2012-08-06 LAB — CBC WITH DIFFERENTIAL/PLATELET
Basophils Absolute: 0 10*3/uL (ref 0.0–0.1)
Basophils Relative: 0 % (ref 0–1)
Eosinophils Absolute: 0.2 10*3/uL (ref 0.0–0.7)
Eosinophils Relative: 1 % (ref 0–5)
HCT: 42.7 % (ref 39.0–52.0)
Hemoglobin: 15 g/dL (ref 13.0–17.0)
Lymphocytes Relative: 17 % (ref 12–46)
Lymphs Abs: 3.1 10*3/uL (ref 0.7–4.0)
MCH: 30.3 pg (ref 26.0–34.0)
MCHC: 35.1 g/dL (ref 30.0–36.0)
MCV: 86.3 fL (ref 78.0–100.0)
Monocytes Absolute: 0.9 10*3/uL (ref 0.1–1.0)
Monocytes Relative: 5 % (ref 3–12)
Neutro Abs: 13.9 10*3/uL — ABNORMAL HIGH (ref 1.7–7.7)
Neutrophils Relative %: 77 % (ref 43–77)
Platelets: 220 10*3/uL (ref 150–400)
RBC: 4.95 MIL/uL (ref 4.22–5.81)
RDW: 12.9 % (ref 11.5–15.5)
WBC: 18.1 10*3/uL — ABNORMAL HIGH (ref 4.0–10.5)

## 2012-08-06 LAB — POCT I-STAT, CHEM 8
BUN: 8 mg/dL (ref 6–23)
Calcium, Ion: 1.19 mmol/L (ref 1.12–1.23)
Chloride: 108 mEq/L (ref 96–112)
Creatinine, Ser: 0.8 mg/dL (ref 0.50–1.35)
Glucose, Bld: 130 mg/dL — ABNORMAL HIGH (ref 70–99)
HCT: 47 % (ref 39.0–52.0)
Hemoglobin: 16 g/dL (ref 13.0–17.0)
Potassium: 3.6 mEq/L (ref 3.5–5.1)
Sodium: 141 mEq/L (ref 135–145)
TCO2: 18 mmol/L (ref 0–100)

## 2012-08-06 LAB — LIPASE, BLOOD: Lipase: 23 U/L (ref 11–59)

## 2012-08-06 LAB — BILIRUBIN, DIRECT: Bilirubin, Direct: <0.1 mg/dL (ref 0.0–0.3)

## 2012-08-06 MED ORDER — SODIUM CHLORIDE 0.9 % IV SOLN
1000.0000 mL | Freq: Once | INTRAVENOUS | Status: AC
  Administered 2012-08-06: 1000 mL via INTRAVENOUS

## 2012-08-06 MED ORDER — ONDANSETRON HCL 4 MG/2ML IJ SOLN
4.0000 mg | Freq: Once | INTRAMUSCULAR | Status: AC
  Administered 2012-08-06: 4 mg via INTRAVENOUS
  Filled 2012-08-06: qty 2

## 2012-08-06 NOTE — ED Notes (Signed)
Pt states been having abdominal pain, n/v, denies diarrhea, pt states been having chills and very diaphoretic.

## 2012-08-06 NOTE — ED Notes (Signed)
Pt c/o vomiting starting x 2 hrs ago. Pt vomiting in triage. Pt is pale and diaphoretic.

## 2012-08-06 NOTE — ED Provider Notes (Signed)
History     CSN: [id number]  Arrival date & time 08/06/12  2216   First MD Initiated Contact with Patient 08/06/12 2241      Chief Complaint  Patient presents with  . Emesis    (Consider location/radiation/quality/duration/timing/severity/associated sxs/prior treatment) Patient is a 37 y.o. male presenting with vomiting. The history is provided by the patient.  Emesis Severity:  Moderate Duration:  2 hours Timing:  Intermittent Quality:  Bilious material Progression:  Unchanged Chronicity:  Recurrent Context: not post-tussive and not self-induced   Relieved by:  Nothing Ineffective treatments:  Antiemetics Associated symptoms: abdominal pain   Associated symptoms: no chills, no cough, no diarrhea, no fever and no myalgias     Past Medical History  Diagnosis Date  . GERD (gastroesophageal reflux disease)   . Allergy   . Chicken pox   . Ulcerative colitis     History reviewed. No pertinent past surgical history.  History reviewed. No pertinent family history.  History  Substance Use Topics  . Smoking status: Former Smoker -- 1.00 packs/day for 9 years    Types: Cigarettes    Quit date: 10/21/2001  . Smokeless tobacco: Never Used  . Alcohol Use: No      Review of Systems  Constitutional: Negative for fever and chills.  Respiratory: Negative for cough and shortness of breath.   Cardiovascular: Negative for chest pain.  Gastrointestinal: Positive for vomiting and abdominal pain. Negative for diarrhea.  Musculoskeletal: Negative for myalgias.  Skin: Negative for rash.  Neurological: Negative for dizziness, weakness and numbness.  All other systems reviewed and are negative.    Allergies  Doxycycline  Home Medications   Current Outpatient Rx  Name  Route  Sig  Dispense  Refill  . Mesalamine (DELZICOL) 400 MG CPDR   Oral   Take 1,200 mg by mouth 2 (two) times daily.         . pantoprazole (PROTONIX) 40 MG tablet   Oral   Take 40 mg by mouth  every morning.            BP 118/73  Pulse 74  Temp(Src) 98.9 F (37.2 C) (Rectal)  Resp 14  SpO2 99%  Physical Exam  Nursing note and vitals reviewed. Constitutional: He is oriented to person, place, and time. He appears well-developed and well-nourished.  HENT:  Head: Normocephalic.  Eyes: Pupils are equal, round, and reactive to light.  Neck: Normal range of motion.  Cardiovascular: Normal rate and regular rhythm.   Pulmonary/Chest: Effort normal.  Abdominal: Soft. Bowel sounds are normal. He exhibits no distension. There is no tenderness.  Musculoskeletal: Normal range of motion.  Neurological: He is alert and oriented to person, place, and time.  Skin: Skin is warm. He is diaphoretic. There is pallor.    ED Course  Procedures (including critical care time)  Labs Reviewed  CBC WITH DIFFERENTIAL - Abnormal; Notable for the following:    WBC 18.1 (*)    Neutro Abs 13.9 (*)    All other components within normal limits  COMPREHENSIVE METABOLIC PANEL - Abnormal; Notable for the following:    CO2 17 (*)    Glucose, Bld 130 (*)    Total Bilirubin 0.2 (*)    All other components within normal limits  URINALYSIS, ROUTINE W REFLEX MICROSCOPIC - Abnormal; Notable for the following:    Ketones, ur 15 (*)    All other components within normal limits  LACTIC ACID, PLASMA - Abnormal; Notable for the following:  Lactic Acid, Venous 3.6 (*)    All other components within normal limits  POCT I-STAT, CHEM 8 - Abnormal; Notable for the following:    Glucose, Bld 130 (*)    All other components within normal limits  LIPASE, BLOOD  BILIRUBIN, DIRECT   Dg Abd Acute W/chest  08/07/2012   *RADIOLOGY REPORT*  Clinical Data: Ulcerative colitis.  Vomiting.  Diaphoresis.  ACUTE ABDOMEN SERIES (ABDOMEN 2 VIEW & CHEST 1 VIEW)  Comparison: None.  Findings: The bowel gas pattern is normal.  No evidence of air- fluid levels or free air.  No radiopaque calculi identified. Several pelvic  phleboliths noted.  Heart size and mediastinal contours are normal.  Both lungs are clear.  No evidence of pleural effusion.  IMPRESSION: Normal bowel gas pattern.  No active cardiopulmonary disease.   Original Report Authenticated By: Earle Gell, M.D.     1. Abdominal pain in male patient   2. GERD (gastroesophageal reflux disease)   3. Intractable nausea and vomiting     ED ECG REPORT   Date: 08/07/2012  EKG Time: 4:01 AM  Rate: 71   Rhythm: normal sinus rhythm,  unchanged from previous tracings  Axis:normal  Intervals:L atrial abnnormality  ST&T Change: none  Narrative Interpretation: normal             MDM        I spoke with Dr. Truman Hayward, who will admit patient for symptom control.  He is requested a CT scan for the acute nature of his symptoms.  He denies had a CT scan within the last 6 weeks and a UDS  Garald Balding, NP 08/07/12 0401

## 2012-08-07 ENCOUNTER — Encounter (HOSPITAL_COMMUNITY): Payer: Self-pay

## 2012-08-07 ENCOUNTER — Observation Stay (HOSPITAL_COMMUNITY): Payer: BC Managed Care – PPO

## 2012-08-07 ENCOUNTER — Emergency Department (HOSPITAL_COMMUNITY): Payer: BC Managed Care – PPO

## 2012-08-07 DIAGNOSIS — R112 Nausea with vomiting, unspecified: Secondary | ICD-10-CM

## 2012-08-07 DIAGNOSIS — D72829 Elevated white blood cell count, unspecified: Secondary | ICD-10-CM | POA: Diagnosis present

## 2012-08-07 DIAGNOSIS — R109 Unspecified abdominal pain: Secondary | ICD-10-CM | POA: Diagnosis present

## 2012-08-07 DIAGNOSIS — K219 Gastro-esophageal reflux disease without esophagitis: Secondary | ICD-10-CM

## 2012-08-07 DIAGNOSIS — R1115 Cyclical vomiting syndrome unrelated to migraine: Secondary | ICD-10-CM

## 2012-08-07 HISTORY — DX: Nausea with vomiting, unspecified: R11.2

## 2012-08-07 LAB — BASIC METABOLIC PANEL
BUN: 8 mg/dL (ref 6–23)
Chloride: 100 mEq/L (ref 96–112)
GFR calc Af Amer: >90 mL/min (ref >90)
GFR calc non Af Amer: >90 mL/min (ref >90)
Potassium: 4.1 mEq/L (ref 3.5–5.1)
Sodium: 133 mEq/L — ABNORMAL LOW (ref 135–145)

## 2012-08-07 LAB — URINALYSIS, ROUTINE W REFLEX MICROSCOPIC
Bilirubin Urine: NEGATIVE
Glucose, UA: NEGATIVE mg/dL
Hgb urine dipstick: NEGATIVE
Ketones, ur: 15 mg/dL — AB
Leukocytes, UA: NEGATIVE
Nitrite: NEGATIVE
Protein, ur: NEGATIVE mg/dL
Specific Gravity, Urine: 1.03 (ref 1.005–1.030)
Urobilinogen, UA: 0.2 mg/dL (ref 0.0–1.0)
pH: 5 (ref 5.0–8.0)

## 2012-08-07 LAB — CBC
HCT: 37.5 % — ABNORMAL LOW (ref 39.0–52.0)
MCHC: 33.6 g/dL (ref 30.0–36.0)
Platelets: 186 10*3/uL (ref 150–400)
RDW: 13.1 % (ref 11.5–15.5)
WBC: 9.1 10*3/uL (ref 4.0–10.5)

## 2012-08-07 LAB — RAPID URINE DRUG SCREEN, HOSP PERFORMED
Amphetamines: NOT DETECTED
Barbiturates: NOT DETECTED
Benzodiazepines: NOT DETECTED
Tetrahydrocannabinol: POSITIVE — AB

## 2012-08-07 LAB — LACTIC ACID, PLASMA: Lactic Acid, Venous: 0.8 mmol/L (ref 0.5–2.2)

## 2012-08-07 MED ORDER — HYDROMORPHONE HCL PF 1 MG/ML IJ SOLN
1.0000 mg | INTRAMUSCULAR | Status: DC | PRN
  Administered 2012-08-07: 1 mg via INTRAVENOUS

## 2012-08-07 MED ORDER — MESALAMINE 400 MG PO CPDR
1200.0000 mg | DELAYED_RELEASE_CAPSULE | Freq: Two times a day (BID) | ORAL | Status: DC
  Administered 2012-08-07: 1200 mg via ORAL
  Filled 2012-08-07 (×2): qty 3

## 2012-08-07 MED ORDER — PANTOPRAZOLE SODIUM 40 MG IV SOLR
40.0000 mg | Freq: Once | INTRAVENOUS | Status: AC
  Administered 2012-08-07: 40 mg via INTRAVENOUS
  Filled 2012-08-07: qty 40

## 2012-08-07 MED ORDER — PANTOPRAZOLE SODIUM 40 MG PO TBEC: 40.0000 mg | DELAYED_RELEASE_TABLET | Freq: Every morning | ORAL | Status: DC

## 2012-08-07 MED ORDER — IOHEXOL 300 MG/ML  SOLN
100.0000 mL | Freq: Once | INTRAMUSCULAR | Status: AC | PRN
  Administered 2012-08-07: 100 mL via INTRAVENOUS

## 2012-08-07 MED ORDER — ONDANSETRON HCL 4 MG/2ML IJ SOLN
4.0000 mg | Freq: Four times a day (QID) | INTRAMUSCULAR | Status: DC | PRN
  Administered 2012-08-07 (×2): 4 mg via INTRAVENOUS
  Filled 2012-08-07 (×2): qty 2

## 2012-08-07 MED ORDER — KCL IN DEXTROSE-NACL 20-5-0.9 MEQ/L-%-% IV SOLN
INTRAVENOUS | Status: DC
  Administered 2012-08-07 (×2): via INTRAVENOUS
  Filled 2012-08-07 (×5): qty 1000

## 2012-08-07 MED ORDER — MORPHINE SULFATE 4 MG/ML IJ SOLN
4.0000 mg | Freq: Once | INTRAMUSCULAR | Status: AC
  Administered 2012-08-07: 4 mg via INTRAVENOUS
  Filled 2012-08-07: qty 1

## 2012-08-07 MED ORDER — ONDANSETRON HCL 4 MG PO TABS: 4.0000 mg | ORAL_TABLET | Freq: Four times a day (QID) | ORAL | Status: DC | PRN

## 2012-08-07 MED ORDER — HYDROMORPHONE HCL PF 1 MG/ML IJ SOLN
1.0000 mg | INTRAMUSCULAR | Status: DC | PRN
  Administered 2012-08-07 (×2): 1 mg via INTRAVENOUS
  Filled 2012-08-07 (×2): qty 1

## 2012-08-07 MED ORDER — IOHEXOL 300 MG/ML  SOLN
50.0000 mL | Freq: Once | INTRAMUSCULAR | Status: AC | PRN
  Administered 2012-08-07: 50 mL via ORAL

## 2012-08-07 MED ORDER — PANTOPRAZOLE SODIUM 40 MG PO TBEC
40.0000 mg | DELAYED_RELEASE_TABLET | Freq: Two times a day (BID) | ORAL | Status: DC
  Administered 2012-08-07: 40 mg via ORAL
  Filled 2012-08-07: qty 1

## 2012-08-07 MED ORDER — SODIUM CHLORIDE 0.9 % IV SOLN
1000.0000 mL | Freq: Once | INTRAVENOUS | Status: AC
  Administered 2012-08-07: 1000 mL via INTRAVENOUS

## 2012-08-07 MED ORDER — FAMOTIDINE IN NACL 20-0.9 MG/50ML-% IV SOLN: 20.0000 mg | Freq: Once | INTRAVENOUS | Status: DC

## 2012-08-07 NOTE — ED Notes (Signed)
Report given to Vera,RN on 5E

## 2012-08-07 NOTE — H&P (Signed)
[facility] History and Physical  Ricky Warren QHU:[id number] DOB: November 04, 1975    PCP:   [name], MD   Chief Complaint: Abdominal pain, nausea and vomiting  HPI: Ricky Warren is an 37 y.o. male with hx of ulcerative colitis on Mesalamine, GERD on Protonix, and hx of intermittent abdominal pain, nausea, vomiting of unclear etiology for the past eight years.  He was admitted into the hospital several times, but was n't given any diagnosis.  He sees a GI doc in [place] and has been compliant with his medication.  He returned to the ER after a visit for similar complaint about a month ago, with negative work up at that time including a abd CT.  Evaluation in the ER today included normal electrolytes and renal fx tests, normal Hb, normal lipase, but a WBC of 18K but no left shift.  His liver Fx tests were normal as well.  Hospitalist was asked to admit him for symptomatic tx.  He denied drug use, alcohol use, and he denied stress related symptoms.  He is a Neurosurgeon.  Rewiew of Systems:  Constitutional: Negative for malaise, fever and chills. No significant weight loss or weight gain Eyes: Negative for eye pain, redness and discharge, diplopia, visual changes, or flashes of light. ENMT: Negative for ear pain, hoarseness, nasal congestion, sinus pressure and sore throat. No headaches; tinnitus, drooling, or problem swallowing. Cardiovascular: Negative for chest pain, palpitations, diaphoresis, dyspnea and peripheral edema. ; No orthopnea, PND Respiratory: Negative for cough, hemoptysis, wheezing and stridor. No pleuritic chestpain. Gastrointestinal: Negative for  diarrhea, constipation, abdominal pain, melena, blood in stool, hematemesis, jaundice and rectal bleeding.    Genitourinary: Negative for frequency, dysuria, incontinence,flank pain and hematuria; Musculoskeletal: Negative for back pain and neck pain. Negative for swelling and trauma.;   Skin: . Negative for pruritus, rash, abrasions, bruising and skin lesion.; ulcerations Neuro: Negative for headache, lightheadedness and neck stiffness. Negative for weakness, altered level of consciousness , altered mental status, extremity weakness, burning feet, involuntary movement, seizure and syncope.  Psych: negative for anxiety, depression, insomnia, tearfulness, panic attacks, hallucinations, paranoia, suicidal or homicidal ideation    Past Medical History  Diagnosis Date  . GERD (gastroesophageal reflux disease)   . Allergy   . Chicken pox   . Ulcerative colitis     History reviewed. No pertinent past surgical history.  Medications:  HOME MEDS: Prior to Admission medications   Medication Sig Start Date End Date Taking? Authorizing Provider  Mesalamine (DELZICOL) 400 MG CPDR Take 1,200 mg by mouth 2 (two) times daily.   Yes Historical Provider, MD  pantoprazole (PROTONIX) 40 MG tablet Take 40 mg by mouth every morning.    Yes Historical Provider, MD     Allergies:  Allergies  Allergen Reactions  . Doxycycline Nausea Only    GI upset, nausea    Social History:   reports that he quit smoking about 10 years ago. His smoking use included Cigarettes. He has a 9 pack-year smoking history. He has never used smokeless tobacco. He reports that he does not drink alcohol. His drug history is not on file.  Family History: History reviewed. No pertinent family history.   Physical Exam: Filed Vitals:   08/06/12 2220 08/07/12 0059 08/07/12 0134  BP: 118/94  118/73  Pulse: 74    Temp: 98.7 F (37.1 C) 98.9 F (37.2 C)   TempSrc: Oral Rectal   Resp: 20  14  SpO2: 100%  99%  Blood pressure 118/73, pulse 74, temperature 98.9 F (37.2 C), temperature source Rectal, resp. rate 14, SpO2 99.00%.  GEN:  Pleasant patient lying in the stretcher in no acute distress; cooperative with exam. PSYCH:  alert and oriented x4; does not appear anxious or depressed; affect is  appropriate. HEENT: Mucous membranes pink and anicteric; PERRLA; EOM intact; no cervical lymphadenopathy nor thyromegaly or carotid bruit; no JVD; There were no stridor. Neck is very supple. Breasts:: Not examined CHEST WALL: No tenderness CHEST: Normal respiration, clear to auscultation bilaterally.  HEART: Regular rate and rhythm.  There are no murmur, rub, or gallops.   BACK: No kyphosis or scoliosis; no CVA tenderness ABDOMEN: soft diffusely tenter, no masses, no organomegaly, normal abdominal bowel sounds; no pannus; no intertriginous candida. There is no rebound and no distention. Rectal Exam: Not done EXTREMITIES: No bone or joint deformity; age-appropriate arthropathy of the hands and knees; no edema; no ulcerations.  There is no calf tenderness. Genitalia: not examined PULSES: 2+ and symmetric SKIN: Normal hydration no rash or ulceration CNS: Cranial nerves 2-12 grossly intact no focal lateralizing neurologic deficit.  Speech is fluent; uvula elevated with phonation, facial symmetry and tongue midline. DTR are normal bilaterally, cerebella exam is intact, barbinski is negative and strengths are equaled bilaterally.  No sensory loss.   Labs on Admission:  Basic Metabolic Panel:  Recent Labs Lab 08/06/12 2240 08/06/12 2257  NA 138 141  K 3.5 3.6  CL 101 108  CO2 17*  --   GLUCOSE 130* 130*  BUN 9 8  CREATININE 0.85 0.80  CALCIUM 9.9  --    Liver Function Tests:  Recent Labs Lab 08/06/12 2240  AST 21  ALT 15  ALKPHOS 93  BILITOT 0.2*  PROT 8.2  ALBUMIN 4.6    Recent Labs Lab 08/06/12 2240  LIPASE 23   No results found for this basename: AMMONIA,  in the last 168 hours CBC:  Recent Labs Lab 08/06/12 2240 08/06/12 2257  WBC 18.1*  --   NEUTROABS 13.9*  --   HGB 15.0 16.0  HCT 42.7 47.0  MCV 86.3  --   PLT 220  --    Cardiac Enzymes: No results found for this basename: CKTOTAL, CKMB, CKMBINDEX, TROPONINI,  in the last 168 hours  CBG: No results  found for this basename: GLUCAP,  in the last 168 hours   Radiological Exams on Admission: Dg Abd Acute W/chest  08/07/2012   *RADIOLOGY REPORT*  Clinical Data: Ulcerative colitis.  Vomiting.  Diaphoresis.  ACUTE ABDOMEN SERIES (ABDOMEN 2 VIEW & CHEST 1 VIEW)  Comparison: None.  Findings: The bowel gas pattern is normal.  No evidence of air- fluid levels or free air.  No radiopaque calculi identified. Several pelvic phleboliths noted.  Heart size and mediastinal contours are normal.  Both lungs are clear.  No evidence of pleural effusion.  IMPRESSION: Normal bowel gas pattern.  No active cardiopulmonary disease.   Original Report Authenticated By: Earle Gell, M.D.    Assessment/Plan Present on Admission:  . Abdominal pain in male patient . Intractable nausea and vomiting . Ulcerative colitis . GERD (gastroesophageal reflux disease) . Leukocytosis  PLAN:  Will admit him for abdominal pain, nausea and vomiting of unclear etiology at this time.  I will get an abdominal/pelvic CT with contrast.  His abdominal exam is not a surgical abdomen.  Will treat him with IV pain meds, IVF, and antiemetics.  I will continue his ulcerative colitis medicine, and will  not start him on antibiotics. His leukocytosis could be demargination (no left shift, no fever) rather than infection.  He is stable, full code, and will be admitted [facility] service.   Thank you for allowing me to partake in the care of your pleasant patient.  Other plans as per orders.  Code Status: FULL Haskel Khan, MD. [facility] Pager [phone] 7pm to 7am.  08/07/2012, 3:48 AM

## 2012-08-07 NOTE — ED Notes (Signed)
Patient transported to X-ray 

## 2012-08-07 NOTE — Progress Notes (Signed)
[facility] PROGRESS NOTE  Jhan Conery MIW:[id number] DOB: Dec 02, 1975 DOA: 08/06/2012 PCP: [name], MD  Assessment/Plan: 1. Epigastric Abdominal pain, Nausea, vomiting: Improved. Unclear etiology. Continue with supportive care, IV fluids, pain medications, protonix change to BID. CT abdomen and pelvis negative. If no improvement will consider GI consultation. LFT normal. Normal gallbladder by ct scan.  2. Metabolic acidosis: mildly increase lactic acid. No hypotension, repeat this am after patient has received IV fluids. Repeat B-met.  3. Leukocytosis: no fever. CT abdomen negative, UA negative. Chest x ray negative for infiltrates. Repeat CBC this am.  4. History of Ulcerative colitis. No diarrhea, less likely ulcerative colitis exacerbation. Continue with mesalamine.   Code Status: Full Family Communication:  Disposition Plan: Home when stable.    Consultants:  none  Procedures:  none  Antibiotics:  none  HPI/Subjective: Feeling better , abdominal pain improved. No vomiting. Feels he could try clear diet. He had endoscopy  And colonoscopy last year which were normal. He has had multiple episodes since 2007.  Objective: Filed Vitals:   08/07/12 0059 08/07/12 0134 08/07/12 0434 08/07/12 0548  BP:  118/73 126/77 123/78  Pulse:   72 71  Temp: 98.9 F (37.2 C)  97.9 F (36.6 C) 98.4 F (36.9 C)  TempSrc: Rectal  Oral Oral  Resp:  14 16 16   Height:    6' 2"  (1.88 m)  Weight:    102.967 kg (227 lb)  SpO2:  99% 100% 98%    Intake/Output Summary (Last 24 hours) at 08/07/12 0810 Last data filed at 08/07/12 0127  Gross per 24 hour  Intake      0 ml  Output    150 ml  Net   -150 ml   Filed Weights   08/07/12 0548  Weight: 102.967 kg (227 lb)    Exam:   General:  No distress.   Cardiovascular: S 1, S 2 RRR  Respiratory: CTA  Abdomen: BS present, soft, NT  Musculoskeletal: no edema.   Data Reviewed: Basic Metabolic Panel:  Recent  Labs Lab 08/06/12 2240 08/06/12 2257  NA 138 141  K 3.5 3.6  CL 101 108  CO2 17*  --   GLUCOSE 130* 130*  BUN 9 8  CREATININE 0.85 0.80  CALCIUM 9.9  --    Liver Function Tests:  Recent Labs Lab 08/06/12 2240  AST 21  ALT 15  ALKPHOS 93  BILITOT 0.2*  PROT 8.2  ALBUMIN 4.6    Recent Labs Lab 08/06/12 2240  LIPASE 23   No results found for this basename: AMMONIA,  in the last 168 hours CBC:  Recent Labs Lab 08/06/12 2240 08/06/12 2257  WBC 18.1*  --   NEUTROABS 13.9*  --   HGB 15.0 16.0  HCT 42.7 47.0  MCV 86.3  --   PLT 220  --    Cardiac Enzymes: No results found for this basename: CKTOTAL, CKMB, CKMBINDEX, TROPONINI,  in the last 168 hours BNP (last 3 results) No results found for this basename: PROBNP,  in the last 8760 hours CBG: No results found for this basename: GLUCAP,  in the last 168 hours  No results found for this or any previous visit (from the past 240 hour(s)).   Studies: Ct Abdomen Pelvis W Contrast  08/07/2012   *RADIOLOGY REPORT*  Clinical Data: Poor orally contrast intake.  Abdominal pain and history of ulcerative colitis.  CT ABDOMEN AND PELVIS WITH CONTRAST  Technique:  Multidetector CT imaging of the  abdomen and pelvis was performed following the standard protocol during bolus administration of intravenous contrast.  Contrast: 64m OMNIPAQUE IOHEXOL 300 MG/ML  SOLN, 1061mOMNIPAQUE IOHEXOL 300 MG/ML  SOLN  Comparison: 06/17/2012  Findings: The bowel is unremarkable.  Specifically, there is no evidence of inflammatory bowel disease.  A normal appendix is visualized.  The lung bases are essentially clear and the heart is normal in size.  Normal liver, spleen, gallbladder and pancreas.  No bile duct dilation.  No adrenal masses.  Normal kidneys, ureters and bladder.  No adenopathy.  No abnormal fluid collections.  There are minor degenerative changes along the visualized spine.  No osteoblastic or osteolytic lesions.  IMPRESSION: No acute  findings.  Normal bowel with no evidence to suggest inflammatory bowel disease.  Normal appendix.   Original Report Authenticated By: DaLajean ManesM.D.   Dg Abd Acute W/chest  08/07/2012   *RADIOLOGY REPORT*  Clinical Data: Ulcerative colitis.  Vomiting.  Diaphoresis.  ACUTE ABDOMEN SERIES (ABDOMEN 2 VIEW & CHEST 1 VIEW)  Comparison: None.  Findings: The bowel gas pattern is normal.  No evidence of air- fluid levels or free air.  No radiopaque calculi identified. Several pelvic phleboliths noted.  Heart size and mediastinal contours are normal.  Both lungs are clear.  No evidence of pleural effusion.  IMPRESSION: Normal bowel gas pattern.  No active cardiopulmonary disease.   Original Report Authenticated By: JoEarle GellM.D.    Scheduled Meds: . Mesalamine  1,200 mg Oral BID  . pantoprazole  40 mg Oral q morning - 10a   Continuous Infusions: . dextrose 5 % and 0.9 % NaCl with KCl 20 mEq/L 125 mL/hr at 08/07/12 0607    Principal Problem:   Abdominal pain in male patient Active Problems:   Ulcerative colitis   GERD (gastroesophageal reflux disease)   Intractable nausea and vomiting   Leukocytosis    Time spent: 35 minutes.     REGALADO,BELKYS  [facility] Pager [phone] 7PM-7AM, please contact night-coverage at [email or web address], password [email or web address], 8:10 AM  LOS: 1 day

## 2012-08-07 NOTE — Progress Notes (Signed)
I phone Dr. Tyrell Antonio at this time to inform her that pt. Is requesting to be discharged, as he is feeling better, and there are important things he needs to take care of.  She tells me she is not comfortable releasing him until tomorrow morning after a food trial.  She states that if he chooses to leave tonight that it would be against medical advice.  I then inform pt. Of this; and he states that he simply must go.  He is alert and in no distress; and reports that he feels "much better".  He ambulates without difficulty.  He signs A.M.A. Form and thanks [place] for our care.  I assure him he will be quite welcome at any [facility] facility if he ever needs medical assistance and that AMA is in no way punitive.  He leaves in good spirits.

## 2012-08-09 NOTE — ED Provider Notes (Signed)
Medical screening examination/treatment/procedure(s) were performed by non-physician practitioner and as supervising physician I was immediately available for consultation/collaboration.  Virgel Manifold, MD 08/09/12 1511

## 2012-08-10 ENCOUNTER — Telehealth: Payer: Self-pay | Admitting: Family Medicine

## 2012-08-10 NOTE — Telephone Encounter (Signed)
Pt was in hospital Sat and Sun w/ vomiting. Pt needs 30 min ov , Thurs or Fri. Only 15 min same day available.. Please advise.

## 2012-08-10 NOTE — Telephone Encounter (Signed)
Put in 15 minute slot, if necessary.

## 2012-08-12 ENCOUNTER — Encounter: Payer: Self-pay | Admitting: Family Medicine

## 2012-08-12 ENCOUNTER — Ambulatory Visit (INDEPENDENT_AMBULATORY_CARE_PROVIDER_SITE_OTHER): Payer: BC Managed Care – PPO | Admitting: Family Medicine

## 2012-08-12 VITALS — BP 130/80 | HR 106 | Temp 99.2°F | Resp 20 | Wt 226.0 lb

## 2012-08-12 DIAGNOSIS — K5190 Ulcerative colitis, unspecified, without complications: Secondary | ICD-10-CM

## 2012-08-12 DIAGNOSIS — R112 Nausea with vomiting, unspecified: Secondary | ICD-10-CM

## 2012-08-12 NOTE — Progress Notes (Signed)
  Subjective:    Patient ID: Ricky Warren, male    DOB: August 25, 1975, 37 y.o.   MRN: [id number]  [facility] followup Patient has history of ulcerative colitis which has previously been followed by gastroenterologist in [place]. History of GERD and history of recurrent episodes of intractable nausea and vomiting Patient relates he had endoscopy as recently as one year ago.  He presented to emergency department on 08/06/2012 with intractable nausea and vomiting. He left the next day AGAINST MEDICAL ADVICE. Patient given IV fluids, pain medications and Protonix increased to twice a day. CT abdomen /pelvis no acute findings and no gallstones. LFTs normal. Chest x-ray and urinalysis unremarkable. Lipase normal. Initial leukocytosis which improved by the next day. Patient never had any fever. He had mild metabolic acidosis which improved following IV fluids  Patient has ulcerative colitis. No recent diarrhea or bloody stools. Takes mesalamine. No recurrent symptoms since discharge. He needs to establish with local gastroenterologist. Patient cannot pinpoint any specific triggers. He recalls with previous episode in April of this year after [place] food and after pizza with most recent episode. He does not recall any localized abdominal pain  Past Medical History  Diagnosis Date  . GERD (gastroesophageal reflux disease)   . Allergy   . Chicken pox   . Ulcerative colitis    No past surgical history on file.  reports that he quit smoking about 10 years ago. His smoking use included Cigarettes. He has a 9 pack-year smoking history. He has never used smokeless tobacco. He reports that he does not drink alcohol. His drug history is not on file. family history is not on file. Allergies  Allergen Reactions  . Doxycycline Nausea Only    GI upset, nausea     Review of Systems  Constitutional: Negative for fever, chills, appetite change and unexpected weight change.  Respiratory: Negative  for cough and shortness of breath.   Cardiovascular: Negative for chest pain.  Gastrointestinal: Negative for abdominal pain, diarrhea, constipation, blood in stool and abdominal distention.  Genitourinary: Negative for dysuria.  Neurological: Negative for dizziness.       Objective:   Physical Exam  Constitutional: He appears well-developed and well-nourished.  HENT:  Mouth/Throat: Oropharynx is clear and moist.  Cardiovascular: Normal rate and regular rhythm.   Pulmonary/Chest: Effort normal and breath sounds normal. No respiratory distress. He has no wheezes. He has no rales.  Abdominal: Soft. Bowel sounds are normal. He exhibits no distension and no mass. There is no tenderness. There is no rebound and no guarding.          Assessment & Plan:  Patient has history of ulcerative colitis. He presents with recurrent episodes of intractable nausea and vomiting which are somewhat infrequent and have occurred over several years. No clear provoking factors-though two most recent episodes occurred after fatty food intake..  Set up GI referral

## 2012-08-12 NOTE — Discharge Summary (Signed)
Physician Discharge Summary  [name] OQH:[id number] DOB: May 12, 1975 DOA: 08/06/2012  PCP: [name], MD  Admit date: 08/06/2012 Discharge date: 08/12/2012  Patient left against medical advised.   Time spent: 30  minutes  Recommendations for Outpatient Follow-up:  1. Needs to follow up with PCP for referral to Gastroenterologist, further evaluation of symptoms.  2. Need B-met to follow electrolytes.   Discharge Diagnoses:    Abdominal pain in male patient   Ulcerative colitis   GERD (gastroesophageal reflux disease)   Intractable nausea and vomiting   Leukocytosis   Discharge Condition: Left against medical advised.   Diet recommendation: was tolerating clear diet.   Filed Weights   08/07/12 0548  Weight: 102.967 kg (227 lb)    History of present illness:  Ricky Warren is an 37 y.o. male with hx of ulcerative colitis on Mesalamine, GERD on Protonix, and hx of intermittent abdominal pain, nausea, vomiting of unclear etiology for the past eight years. He was admitted into the hospital several times, but was n't given any diagnosis. He sees a GI doc in [place] and has been compliant with his medication. He returned to the ER after a visit for similar complaint about a month ago, with negative work up at that time including a abd CT. Evaluation in the ER today included normal electrolytes and renal fx tests, normal Hb, normal lipase, but a WBC of 18K but no left shift. His liver Fx tests were normal as well. Hospitalist was asked to admit him for symptomatic tx. He denied drug use, alcohol use, and he denied stress related symptoms. He is a Neurosurgeon.   Hospital Course:   Epigastric Abdominal pain, Nausea, vomiting: Improved. Patient was treated  with supportive care, IV fluids, pain medications, protonix change to BID. CT abdomen and pelvis negative. If no improvement will consider GI consultation. LFT normal. Normal gallbladder by ct  scan. Patient the night of 05-31 wanted to be discharge. Unclear if patient was tolerating clear diet. It was recommended to patient to stay in hospital, he left against medical advised. UDS with positive tetrahydrocannabinol  and opioids. He has had endoscopy normal within 2 years per patient report.   Metabolic acidosis: mildly increase lactic acid. No hypotension, repeat this am after patient has received IV fluids. Bicarb increase to 24 from 17. Lactic acid decrease to 0.8 from 3.6.  Leukocytosis: no fever. CT abdomen negative, UA negative. Chest x ray negative for infiltrates. Repeat CBC this am.   History of Ulcerative colitis. No diarrhea, less likely ulcerative colitis exacerbation. Continue with mesalamine.    Procedures:  none  Consultations:  none  Discharge Exam: Filed Vitals:   08/07/12 0134 08/07/12 0434 08/07/12 0548 08/07/12 1333  BP: 118/73 126/77 123/78 103/65  Pulse:  72 71 82  Temp:  97.9 F (36.6 C) 98.4 F (36.9 C) 99.1 F (37.3 C)  TempSrc:  Oral Oral Oral  Resp: 14 16 16 16   Height:   6' 2"  (1.88 m)   Weight:   102.967 kg (227 lb)   SpO2: 99% 100% 98% 97%    Discharge Instructions   Future Appointments Provider Department Dept Phone   08/12/2012 2:45 PM [name], MD [facility]       Medication List    ASK your doctor about these medications       DELZICOL 400 MG Cpdr  Generic drug:  Mesalamine  Take 1,200 mg by mouth 2 (two)  times daily.     pantoprazole 40 MG tablet  Commonly known as:  PROTONIX  Take 40 mg by mouth every morning.       Allergies  Allergen Reactions  . Doxycycline Nausea Only    GI upset, nausea      The results of significant diagnostics from this hospitalization (including imaging, microbiology, ancillary and laboratory) are listed below for reference.    Significant Diagnostic Studies: Ct Abdomen Pelvis W Contrast  08/07/2012   *RADIOLOGY REPORT*  Clinical Data:  Poor orally contrast intake.  Abdominal pain and history of ulcerative colitis.  CT ABDOMEN AND PELVIS WITH CONTRAST  Technique:  Multidetector CT imaging of the abdomen and pelvis was performed following the standard protocol during bolus administration of intravenous contrast.  Contrast: 68m OMNIPAQUE IOHEXOL 300 MG/ML  SOLN, 1041mOMNIPAQUE IOHEXOL 300 MG/ML  SOLN  Comparison: 06/17/2012  Findings: The bowel is unremarkable.  Specifically, there is no evidence of inflammatory bowel disease.  A normal appendix is visualized.  The lung bases are essentially clear and the heart is normal in size.  Normal liver, spleen, gallbladder and pancreas.  No bile duct dilation.  No adrenal masses.  Normal kidneys, ureters and bladder.  No adenopathy.  No abnormal fluid collections.  There are minor degenerative changes along the visualized spine.  No osteoblastic or osteolytic lesions.  IMPRESSION: No acute findings.  Normal bowel with no evidence to suggest inflammatory bowel disease.  Normal appendix.   Original Report Authenticated By: DaLajean ManesM.D.   Dg Abd Acute W/chest  08/07/2012   *RADIOLOGY REPORT*  Clinical Data: Ulcerative colitis.  Vomiting.  Diaphoresis.  ACUTE ABDOMEN SERIES (ABDOMEN 2 VIEW & CHEST 1 VIEW)  Comparison: None.  Findings: The bowel gas pattern is normal.  No evidence of air- fluid levels or free air.  No radiopaque calculi identified. Several pelvic phleboliths noted.  Heart size and mediastinal contours are normal.  Both lungs are clear.  No evidence of pleural effusion.  IMPRESSION: Normal bowel gas pattern.  No active cardiopulmonary disease.   Original Report Authenticated By: JoEarle GellM.D.    Microbiology: No results found for this or any previous visit (from the past 240 hour(s)).   Labs: Basic Metabolic Panel:  Recent Labs Lab 08/06/12 2240 08/06/12 2257 08/07/12 0817  NA 138 141 133*  K 3.5 3.6 4.1  CL 101 108 100  CO2 17*  --  24  GLUCOSE 130* 130* 151*  BUN  9 8 8   CREATININE 0.85 0.80 0.76  CALCIUM 9.9  --  8.9   Liver Function Tests:  Recent Labs Lab 08/06/12 2240  AST 21  ALT 15  ALKPHOS 93  BILITOT 0.2*  PROT 8.2  ALBUMIN 4.6    Recent Labs Lab 08/06/12 2240  LIPASE 23   No results found for this basename: AMMONIA,  in the last 168 hours CBC:  Recent Labs Lab 08/06/12 2240 08/06/12 2257 08/07/12 0817  WBC 18.1*  --  9.1  NEUTROABS 13.9*  --   --   HGB 15.0 16.0 12.6*  HCT 42.7 47.0 37.5*  MCV 86.3  --  87.2  PLT 220  --  186   Cardiac Enzymes: No results found for this basename: CKTOTAL, CKMB, CKMBINDEX, TROPONINI,  in the last 168 hours BNP: BNP (last 3 results) No results found for this basename: PROBNP,  in the last 8760 hours CBG: No results found for this basename: GLUCAP,  in the last 168 hours  Signed:  Cledis Sohn  [facility] 08/12/2012, 10:02 AM

## 2012-08-12 NOTE — Patient Instructions (Addendum)
We will call you with GI appointment.

## 2012-08-28 ENCOUNTER — Other Ambulatory Visit: Payer: Self-pay | Admitting: Family Medicine

## 2012-08-30 NOTE — Telephone Encounter (Signed)
Rx request to pharmacy/[facility]  

## 2012-10-20 ENCOUNTER — Encounter: Payer: Self-pay | Admitting: Internal Medicine

## 2012-10-23 ENCOUNTER — Other Ambulatory Visit: Payer: Self-pay | Admitting: Family Medicine

## 2012-11-22 ENCOUNTER — Encounter: Payer: Self-pay | Admitting: Internal Medicine

## 2012-11-22 ENCOUNTER — Ambulatory Visit (INDEPENDENT_AMBULATORY_CARE_PROVIDER_SITE_OTHER): Payer: BC Managed Care – PPO | Admitting: Internal Medicine

## 2012-11-22 ENCOUNTER — Other Ambulatory Visit: Payer: BC Managed Care – PPO

## 2012-11-22 VITALS — BP 120/82 | HR 115 | Ht 74.0 in | Wt 209.0 lb

## 2012-11-22 DIAGNOSIS — J309 Allergic rhinitis, unspecified: Secondary | ICD-10-CM

## 2012-11-22 DIAGNOSIS — K5130 Ulcerative (chronic) rectosigmoiditis without complications: Secondary | ICD-10-CM

## 2012-11-22 DIAGNOSIS — R1115 Cyclical vomiting syndrome unrelated to migraine: Secondary | ICD-10-CM

## 2012-11-22 DIAGNOSIS — J302 Other seasonal allergic rhinitis: Secondary | ICD-10-CM

## 2012-11-22 DIAGNOSIS — R112 Nausea with vomiting, unspecified: Secondary | ICD-10-CM

## 2012-11-22 NOTE — Patient Instructions (Addendum)
Your physician has requested that you go to the basement for the following lab work before leaving today: IBD Expanded Panel  We will call you with your lab results    You have an appointment with Dr Neldon Mc at [facility] on Tuesday, October 7 at 2:30 pm. Please be there 15 minutes early for registration. Information will come to your address for you to fill out along with a map to their location. Please no antihistamine three days prior to your appointment   Address and phone number is below  [address], [place], [place] [address] [phone]

## 2012-11-22 NOTE — Assessment & Plan Note (Addendum)
Not clear to me he has this i.e. Chronic IBD. He only had acute changes at initial 2005 colonoscopy and was having pain, not bleeding or diarrhea.  - will check IBD expanded panel If negative stop Asacol HD and see if he does ok If + continue IBD panel negative so will stop mesalamine and reassess. He is aware he could have recurrent problems but this is worth a try. i suppose he could have Crohn's disease w/ negative serologies and small bowel sxs/signs (vomiting ) could be related but doubt.

## 2012-11-22 NOTE — Assessment & Plan Note (Addendum)
Allergy evaluation seems appropriate given that he seems to have this rare, episodic vomiting problem each Spring.He also has seasonal allergy sxs. No clear role for PPI or other GI-specific Tx . Doubt this is a manifestation of IBD.

## 2012-11-22 NOTE — Progress Notes (Signed)
Subjective:    Patient ID: Ricky Warren, male    DOB: 07-Jul-1975, 37 y.o.   MRN: [id number]  HPI The patient is a pleasant middle-aged man with a history of ulcerative proctosigmoiditis diagnosed when he lived in [place], in 2005. Biopsies showed acute inflammatory changes. Subsequent exams on mesalamine have always been normal (biopsies also). He had pain and not much if any diarrhea at time of dx and no diarrhea or bleeding problems since. Multiple EGD's without abnormality except acute superficial duodenitis 2005.  He has had a # of years of episodic severe vomiting, usually in the spring and usually about 1x a year. These have required ED visits and/or hospitalization. Has had two this year w/ negative CT imaging and labs. Just sudden onset of nausea followed by severe and profuse vomiting. No pain except some after. No cause ever. Does have hx of allergies (seasonal). No headaches. Has used a PPI chronically.  Have occurred in [place], [place] and now [place].  He also has had bloating, various abdominal pains and diagnosed with IBS in past. Allergies  Allergen Reactions  . Ciprofloxacin   . Doxycycline Nausea Only    GI upset, nausea   Outpatient Prescriptions Prior to Visit  Medication Sig Dispense Refill  . pantoprazole (PROTONIX) 40 MG tablet Take 40 mg by mouth every morning.       Marland Kitchen DELZICOL 400 MG CPDR TAKE 3 CAPSULES TWICE A DAY  180 capsule  0  . pantoprazole (PROTONIX) 40 MG tablet TAKE 1 TABLET (40 MG TOTAL) BY MOUTH DAILY.  30 tablet  5   No facility-administered medications prior to visit.   Past Medical History  Diagnosis Date  . GERD (gastroesophageal reflux disease)   . Allergy   . Chicken pox   . Ulcerative colitis   . Ulcerative proctosigmoiditis   . Intractable nausea and vomiting - episodic 08/07/2012   Past Surgical History  Procedure Laterality Date  . Esophagogastroduodenoscopy      multiple  . Colonoscopy w/ biopsies      multiple   History    Social History  . Marital Status: Single                 Social History Main Topics  . Smoking status: Former Smoker -- 1.00 packs/day for 9 years    Types: Cigarettes    Quit date: 10/21/2001  . Smokeless tobacco: Never Used  . Alcohol Use: No  . 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)   Family History  Problem Relation Age of Onset  . Colon cancer Maternal Grandfather     possible  . Colon cancer      great mat. GF, possible  . Heart disease Paternal Grandfather   . Stomach cancer Maternal Grandmother   . Heart attack Paternal Uncle     Review of Systems + allergies/sinus sxs, myalgia, joint and back pain, fatigue, insomnia, sore throat night sweats All other ROS negative or as per HPI    Objective:   Physical Exam General:  Well-developed, well-nourished and in no acute distress Eyes:  anicteric. ENT:   Mouth and posterior pharynx free of lesions but there is slight cobblestoning  Neck:   supple w/o thyromegaly or mass.  Lungs: Clear to auscultation bilaterally. Heart:  S1S2, no rubs, murmurs, gallops. Abdomen:  soft, non-tender, no hepatosplenomegaly, hernia, or mass and BS+.  Lymph:  no  cervical or supraclavicular adenopathy. Extremities:   no edema Skin   no rash. Neuro:  A&O x 3.  Psych:  appropriate mood and  Affect.   Data Reviewed: CT abd/pelvis April and May 2014 - negative AAS negative Lab Results  Component Value Date   WBC 9.1 08/07/2012   HGB 12.6* 08/07/2012   HCT 37.5* 08/07/2012   MCV 87.2 08/07/2012   PLT 186 08/07/2012   Lab Results  Component Value Date   LIPASE 23 08/06/2012     Chemistry      Component Value Date/Time   NA 133* 08/07/2012 0817   K 4.1 08/07/2012 0817   CL 100 08/07/2012 0817   CO2 24 08/07/2012 0817   BUN 8 08/07/2012 0817   CREATININE 0.76 08/07/2012 0817      Component Value Date/Time   CALCIUM 8.9 08/07/2012 0817    ALKPHOS 93 08/06/2012 2240   AST 21 08/06/2012 2240   ALT 15 08/06/2012 2240   BILITOT 0.2* 08/06/2012 2240     Multiple GI records, [place] report, colonoscopy, upper endoscopy, pathology reports (findings summarized in HPI) [facility] [place] [place]. 2005-2013     Assessment & Plan:  Intractable nausea and vomiting - episodic - Plan: IBD Expanded Panel  Ulcerative proctosigmoiditis - Plan: IBD Expanded Panel  Seasonal allergies - Plan: Ambulatory referral to Allergy

## 2012-11-23 LAB — IBD EXPANDED PANEL
ACCA: 27 units (ref 0–90)
ALCA: 11 units (ref 0–60)
AMCA: 50 units (ref 0–100)
Atypical pANCA: NEGATIVE
gASCA: 14 units (ref 0–50)

## 2012-11-24 NOTE — Progress Notes (Signed)
Quick Note:  IBD serologies are negative Tell him to stop mesalamine Set up 2-3 month REV ______

## 2012-11-25 ENCOUNTER — Encounter: Payer: Self-pay | Admitting: Internal Medicine

## 2012-12-20 ENCOUNTER — Ambulatory Visit (INDEPENDENT_AMBULATORY_CARE_PROVIDER_SITE_OTHER): Payer: BC Managed Care – PPO | Admitting: Family Medicine

## 2012-12-20 ENCOUNTER — Encounter: Payer: Self-pay | Admitting: Family Medicine

## 2012-12-20 VITALS — BP 130/80 | HR 91 | Temp 98.0°F | Wt 206.0 lb

## 2012-12-20 DIAGNOSIS — R1084 Generalized abdominal pain: Secondary | ICD-10-CM

## 2012-12-20 MED ORDER — PROMETHAZINE HCL 25 MG RE SUPP: 25.0000 mg | Freq: Four times a day (QID) | RECTAL | Status: DC | PRN

## 2012-12-20 MED ORDER — LORAZEPAM 0.5 MG PO TABS: 0.5000 mg | ORAL_TABLET | Freq: Three times a day (TID) | ORAL | Status: DC | PRN

## 2012-12-20 NOTE — Progress Notes (Signed)
  Subjective:    Patient ID: Ricky Warren, male    DOB: March 28, 1975, 37 y.o.   MRN: [id number]  HPI Patient has history of reported ulcerative proctosigmoiditis dxed 2005. Recently saw gastroenterologist and they had suggested discontinuing his Asacol. He has not yet discontinued. He is having some lower abdominal cramping which he thinks is stress related. He is preparing to take a one month trip out [place]. He has some anxiety regarding the travel. He has had similar abdominal pains in the past related to stress.  Once previously, he tried Lexapro but had weight gain. He is not having any current diarrhea or bloody stools. He has some diffuse lower, cramping intermittently. He takes chronic protonix and mesalamine  Past Medical History  Diagnosis Date  . GERD (gastroesophageal reflux disease)   . Allergy   . Chicken pox   . Ulcerative colitis   . Ulcerative proctosigmoiditis   . Intractable nausea and vomiting - episodic 08/07/2012   Past Surgical History  Procedure Laterality Date  . Esophagogastroduodenoscopy      multiple  . Colonoscopy w/ biopsies      multiple    reports that he quit smoking about 11 years ago. His smoking use included Cigarettes. He has a 9 pack-year smoking history. He has never used smokeless tobacco. He reports that he does not drink alcohol. His drug history is not on file. family history includes Colon cancer in his maternal grandfather and another family member; Heart attack in his paternal uncle; Heart disease in his paternal grandfather; Stomach cancer in his maternal grandmother. Allergies  Allergen Reactions  . Ciprofloxacin   . Doxycycline Nausea Only    GI upset, nausea      Review of Systems  Constitutional: Negative for fever, chills, appetite change and unexpected weight change.  Respiratory: Negative for shortness of breath.   Gastrointestinal: Positive for abdominal pain. Negative for nausea, vomiting, diarrhea and blood in stool.        Objective:   Physical Exam  Constitutional: He appears well-developed and well-nourished.  HENT:  Mouth/Throat: Oropharynx is clear and moist.  Cardiovascular: Normal rate and regular rhythm.   Pulmonary/Chest: Effort normal and breath sounds normal. No respiratory distress. He has no wheezes. He has no rales.  Abdominal: Soft. Bowel sounds are normal. He exhibits no distension and no mass. There is no tenderness. There is no rebound and no guarding.  Neurological: He is alert.          Assessment & Plan:  Patient has diffuse intermittent abdominal cramps with likely stress trigger.  No diarrhea or other indication of active colitis. We discussed ideas for coping with anxiety.  Limited lorazepam 0.5 mgs one every 6-8 hours for severe anxiety symptoms. We discussed possible SSRI use and he is reluctant at this time. We have advised against regular use of benzodiazepines. Phenergan 25 mg supp every 8 hours prn for severe nausea and vomiting.

## 2013-01-27 ENCOUNTER — Ambulatory Visit: Payer: BC Managed Care – PPO | Admitting: Internal Medicine

## 2013-03-06 ENCOUNTER — Inpatient Hospital Stay (HOSPITAL_COMMUNITY)
Admission: EM | Admit: 2013-03-06 | Discharge: 2013-03-08 | DRG: 392 | Disposition: A | Discharge status: Home / Self Care | Payer: BC Managed Care – PPO | Attending: Internal Medicine | Admitting: Internal Medicine

## 2013-03-06 ENCOUNTER — Encounter (HOSPITAL_COMMUNITY): Payer: Self-pay | Admitting: Emergency Medicine

## 2013-03-06 DIAGNOSIS — A088 Other specified intestinal infections: Principal | ICD-10-CM | POA: Diagnosis present

## 2013-03-06 DIAGNOSIS — R5381 Other malaise: Secondary | ICD-10-CM | POA: Diagnosis present

## 2013-03-06 DIAGNOSIS — Z800 Family history of malignant neoplasm of digestive organs: Secondary | ICD-10-CM

## 2013-03-06 DIAGNOSIS — M6281 Muscle weakness (generalized): Secondary | ICD-10-CM

## 2013-03-06 DIAGNOSIS — K219 Gastro-esophageal reflux disease without esophagitis: Secondary | ICD-10-CM

## 2013-03-06 DIAGNOSIS — Z8249 Family history of ischemic heart disease and other diseases of the circulatory system: Secondary | ICD-10-CM

## 2013-03-06 DIAGNOSIS — M2550 Pain in unspecified joint: Secondary | ICD-10-CM

## 2013-03-06 DIAGNOSIS — R5382 Chronic fatigue, unspecified: Secondary | ICD-10-CM

## 2013-03-06 DIAGNOSIS — R1115 Cyclical vomiting syndrome unrelated to migraine: Secondary | ICD-10-CM | POA: Diagnosis present

## 2013-03-06 DIAGNOSIS — E860 Dehydration: Secondary | ICD-10-CM

## 2013-03-06 DIAGNOSIS — K529 Noninfective gastroenteritis and colitis, unspecified: Secondary | ICD-10-CM

## 2013-03-06 DIAGNOSIS — K5130 Ulcerative (chronic) rectosigmoiditis without complications: Secondary | ICD-10-CM

## 2013-03-06 DIAGNOSIS — R109 Unspecified abdominal pain: Secondary | ICD-10-CM

## 2013-03-06 DIAGNOSIS — R112 Nausea with vomiting, unspecified: Secondary | ICD-10-CM

## 2013-03-06 DIAGNOSIS — Z87891 Personal history of nicotine dependence: Secondary | ICD-10-CM

## 2013-03-06 LAB — COMPREHENSIVE METABOLIC PANEL
ALT: 31 U/L (ref 0–53)
Albumin: 4.6 g/dL (ref 3.5–5.2)
Alkaline Phosphatase: 88 U/L (ref 39–117)
Calcium: 10.1 mg/dL (ref 8.4–10.5)
Potassium: 4.4 mEq/L (ref 3.5–5.1)
Sodium: 140 mEq/L (ref 135–145)
Total Protein: 8.3 g/dL (ref 6.0–8.3)

## 2013-03-06 LAB — LIPASE, BLOOD: Lipase: 83 U/L — ABNORMAL HIGH (ref 11–59)

## 2013-03-06 LAB — CBC WITH DIFFERENTIAL/PLATELET
Basophils Relative: 0 % (ref 0–1)
Eosinophils Absolute: 0 10*3/uL (ref 0.0–0.7)
Eosinophils Relative: 0 % (ref 0–5)
MCH: 31.1 pg (ref 26.0–34.0)
MCHC: 35.2 g/dL (ref 30.0–36.0)
MCV: 88.4 fL (ref 78.0–100.0)
Neutrophils Relative %: 92 % — ABNORMAL HIGH (ref 43–77)
Platelets: 178 10*3/uL (ref 150–400)
RDW: 12.8 % (ref 11.5–15.5)

## 2013-03-06 MED ORDER — SODIUM CHLORIDE 0.9 % IV BOLUS (SEPSIS)
1000.0000 mL | Freq: Once | INTRAVENOUS | Status: AC
  Administered 2013-03-06: 1000 mL via INTRAVENOUS

## 2013-03-06 MED ORDER — ONDANSETRON HCL 4 MG/2ML IJ SOLN
4.0000 mg | Freq: Once | INTRAMUSCULAR | Status: AC
  Administered 2013-03-06: 4 mg via INTRAVENOUS
  Filled 2013-03-06: qty 2

## 2013-03-06 MED ORDER — MORPHINE SULFATE 2 MG/ML IJ SOLN
2.0000 mg | Freq: Once | INTRAMUSCULAR | Status: AC
  Administered 2013-03-06: 2 mg via INTRAVENOUS
  Filled 2013-03-06: qty 1

## 2013-03-06 MED ORDER — PROMETHAZINE HCL 25 MG/ML IJ SOLN
25.0000 mg | Freq: Once | INTRAMUSCULAR | Status: AC
  Administered 2013-03-06: 25 mg via INTRAVENOUS
  Filled 2013-03-06: qty 1

## 2013-03-06 MED ORDER — MORPHINE SULFATE 4 MG/ML IJ SOLN
4.0000 mg | Freq: Once | INTRAMUSCULAR | Status: AC
  Administered 2013-03-06: 4 mg via INTRAVENOUS
  Filled 2013-03-06: qty 1

## 2013-03-06 MED ORDER — SODIUM CHLORIDE 0.9 % IV SOLN
1000.0000 mL | Freq: Once | INTRAVENOUS | Status: AC
  Administered 2013-03-06: 1000 mL via INTRAVENOUS

## 2013-03-06 NOTE — ED Notes (Signed)
Observed emesis episode small amount. Clear green secretion. Patient unable to do fluid challenge at this time will . Will reassess. Patient is currently sitting up with lights on . Per orders .

## 2013-03-06 NOTE — ED Notes (Signed)
Per pt, V/D chills since this am-vomited "a lot"

## 2013-03-06 NOTE — ED Provider Notes (Signed)
CSN: [id number]     Arrival date & time 03/06/13  1419 History   First MD Initiated Contact with Patient 03/06/13 1522     Chief Complaint  Patient presents with  . Emesis   (Consider location/radiation/quality/duration/timing/severity/associated sxs/prior Treatment) Patient is a 37 y.o. male presenting with vomiting. The history is provided by the patient. No language interpreter was used.  Emesis Severity:  Moderate Duration:  1 day Timing:  Intermittent Context: not post-tussive and not self-induced   Associated symptoms: chills and diarrhea   Associated symptoms: no abdominal pain   Diarrhea:    Quality:  Watery   Severity:  Moderate   Duration:  1 day Risk factors: sick contacts   Risk factors: no suspect food intake   Pt is a 37 year old male who presents with vomiting and diarrhea. He reports that his vomiting started this morning and then diarrhea followed. He reports numerous occurences of both today. He denies any bloody emesis or stool, unsure if he has had a fever. He reports that he has had family members that have been sick with similar symptoms. He denies shortness of breath, difficulty breathing or respiratory symptoms. He denies suspicious food intake or recent travel. Pt reports a history of ulcerative colitis however, this does not feel like a flare up. He has had periods of N/V like this before with unknown etiology. Has had two negative Abd/pelvis CT's in the last year.    Past Medical History  Diagnosis Date  . GERD (gastroesophageal reflux disease)   . Allergy   . Chicken pox   . Ulcerative colitis   . Ulcerative proctosigmoiditis   . Intractable nausea and vomiting - episodic 08/07/2012   Past Surgical History  Procedure Laterality Date  . Esophagogastroduodenoscopy      multiple  . Colonoscopy w/ biopsies      multiple   Family History  Problem Relation Age of Onset  . Colon cancer Maternal Grandfather     possible  . Colon cancer      great  mat. GF, possible  . Heart disease Paternal Grandfather   . Stomach cancer Maternal Grandmother   . Heart attack Paternal Uncle    History  Substance Use Topics  . Smoking status: Former Smoker -- 1.00 packs/day for 9 years    Types: Cigarettes    Quit date: 10/21/2001  . Smokeless tobacco: Never Used  . Alcohol Use: No    Review of Systems  Constitutional: Positive for chills. Negative for fever.  Respiratory: Negative for cough, shortness of breath and wheezing.   Gastrointestinal: Positive for nausea, vomiting and diarrhea. Negative for abdominal pain.  Musculoskeletal: Negative for neck pain and neck stiffness.  All other systems reviewed and are negative.    Allergies  Ciprofloxacin and Doxycycline  Home Medications  No current outpatient prescriptions on file. BP 115/77  Pulse 72  Temp(Src) 98.4 F (36.9 C) (Oral)  Resp 18  SpO2 97% Physical Exam  Nursing note and vitals reviewed. Constitutional: He is oriented to person, place, and time. He appears well-developed and well-nourished. No distress.  HENT:  Head: Normocephalic and atraumatic.  Mouth/Throat: Oropharynx is clear and moist.  Eyes: Conjunctivae and EOM are normal.  Neck: Normal range of motion. Neck supple. No JVD present. No tracheal deviation present. No thyromegaly present.  Cardiovascular: Normal rate, regular rhythm, normal heart sounds and intact distal pulses.   Pulmonary/Chest: Effort normal and breath sounds normal. No respiratory distress. He has no wheezes.  Abdominal: Soft. Bowel sounds are normal. He exhibits no distension.  Musculoskeletal: Normal range of motion.  Lymphadenopathy:    He has no cervical adenopathy.  Neurological: He is alert and oriented to person, place, and time.  Skin: Skin is warm and dry.  Psychiatric: He has a normal mood and affect. His behavior is normal. Judgment and thought content normal.    ED Course  Procedures (including critical care time) Labs  Review Labs Reviewed  CBC WITH DIFFERENTIAL  COMPREHENSIVE METABOLIC PANEL  LIPASE, BLOOD   Imaging Review No results found.  EKG Interpretation   None       MDM   1. Dehydration   2. Abdominal pain in male patient   3. Gastroenteritis    NS x 2 liters with zofran and phenergan. Pt continues to have some vomiting and not tolerating PO liquids. Probable viral gastroenteritis, family that he stayed with in the last few days has had similar symptoms. Afebrile. No peritoneal signs. Will admit for intractable vomiting and dehydration.      Elisha Headland, NP 03/07/13 [phone]

## 2013-03-06 NOTE — ED Notes (Signed)
Patient is slightly improving took some sips and has held water down for now. Will monitor. Attending provider is aware.

## 2013-03-06 NOTE — ED Notes (Signed)
Patient c/o nausea - will make attending aware for additional antiemetic

## 2013-03-07 ENCOUNTER — Encounter (HOSPITAL_COMMUNITY): Payer: Self-pay | Admitting: Internal Medicine

## 2013-03-07 DIAGNOSIS — R5381 Other malaise: Secondary | ICD-10-CM

## 2013-03-07 DIAGNOSIS — E860 Dehydration: Secondary | ICD-10-CM | POA: Diagnosis present

## 2013-03-07 DIAGNOSIS — K5289 Other specified noninfective gastroenteritis and colitis: Secondary | ICD-10-CM

## 2013-03-07 DIAGNOSIS — K529 Noninfective gastroenteritis and colitis, unspecified: Secondary | ICD-10-CM | POA: Diagnosis present

## 2013-03-07 DIAGNOSIS — R109 Unspecified abdominal pain: Secondary | ICD-10-CM

## 2013-03-07 LAB — COMPREHENSIVE METABOLIC PANEL
Albumin: 3.5 g/dL (ref 3.5–5.2)
BUN: 11 mg/dL (ref 6–23)
Calcium: 8.8 mg/dL (ref 8.4–10.5)
Creatinine, Ser: 0.81 mg/dL (ref 0.50–1.35)
GFR calc non Af Amer: >90 mL/min (ref >90)
Total Protein: 6.7 g/dL (ref 6.0–8.3)

## 2013-03-07 LAB — CBC
HCT: 41.1 % (ref 39.0–52.0)
MCHC: 33.8 g/dL (ref 30.0–36.0)
MCV: 88.6 fL (ref 78.0–100.0)
RBC: 4.64 MIL/uL (ref 4.22–5.81)
WBC: 10.7 10*3/uL — ABNORMAL HIGH (ref 4.0–10.5)

## 2013-03-07 LAB — MAGNESIUM: Magnesium: 1.6 mg/dL (ref 1.5–2.5)

## 2013-03-07 LAB — PHOSPHORUS: Phosphorus: 3.2 mg/dL (ref 2.3–4.6)

## 2013-03-07 MED ORDER — HYDROMORPHONE HCL PF 1 MG/ML IJ SOLN
0.5000 mg | INTRAMUSCULAR | Status: DC | PRN
  Administered 2013-03-07: 0.5 mg via INTRAVENOUS
  Filled 2013-03-07: qty 1

## 2013-03-07 MED ORDER — ONDANSETRON HCL 4 MG PO TABS: 4.0000 mg | ORAL_TABLET | Freq: Four times a day (QID) | ORAL | Status: DC | PRN

## 2013-03-07 MED ORDER — MORPHINE SULFATE 2 MG/ML IJ SOLN
1.0000 mg | INTRAMUSCULAR | Status: DC | PRN
  Administered 2013-03-07: 2 mg via INTRAVENOUS

## 2013-03-07 MED ORDER — ACETAMINOPHEN 325 MG PO TABS: 650.0000 mg | ORAL_TABLET | Freq: Four times a day (QID) | ORAL | Status: DC | PRN

## 2013-03-07 MED ORDER — SODIUM CHLORIDE 0.9 % IV SOLN
INTRAVENOUS | Status: AC
  Administered 2013-03-07: 150 mL/h via INTRAVENOUS

## 2013-03-07 MED ORDER — HYDROMORPHONE HCL PF 1 MG/ML IJ SOLN
0.5000 mg | Freq: Once | INTRAMUSCULAR | Status: AC
  Administered 2013-03-07: 0.5 mg via INTRAVENOUS

## 2013-03-07 MED ORDER — DOCUSATE SODIUM 100 MG PO CAPS
100.0000 mg | ORAL_CAPSULE | Freq: Two times a day (BID) | ORAL | Status: DC
  Administered 2013-03-07: 100 mg via ORAL
  Filled 2013-03-07 (×5): qty 1

## 2013-03-07 MED ORDER — SODIUM CHLORIDE 0.9 % IV SOLN
INTRAVENOUS | Status: AC
  Administered 2013-03-07: 02:00:00 via INTRAVENOUS

## 2013-03-07 MED ORDER — HYDROCODONE-ACETAMINOPHEN 5-325 MG PO TABS: 1.0000 | ORAL_TABLET | ORAL | Status: DC | PRN

## 2013-03-07 MED ORDER — PANTOPRAZOLE SODIUM 40 MG PO TBEC
40.0000 mg | DELAYED_RELEASE_TABLET | Freq: Every day | ORAL | Status: DC
  Administered 2013-03-07 – 2013-03-08 (×2): 40 mg via ORAL
  Filled 2013-03-07 (×2): qty 1

## 2013-03-07 MED ORDER — PROMETHAZINE HCL 25 MG/ML IJ SOLN
12.5000 mg | Freq: Three times a day (TID) | INTRAMUSCULAR | Status: DC | PRN
  Administered 2013-03-07 (×2): 12.5 mg via INTRAVENOUS
  Filled 2013-03-07: qty 1

## 2013-03-07 MED ORDER — SODIUM CHLORIDE 0.9 % IV SOLN: 1000.0000 mL | Freq: Once | INTRAVENOUS | Status: AC

## 2013-03-07 MED ORDER — ACETAMINOPHEN 650 MG RE SUPP: 650.0000 mg | Freq: Four times a day (QID) | RECTAL | Status: DC | PRN

## 2013-03-07 MED ORDER — ONDANSETRON HCL 4 MG/2ML IJ SOLN: 4.0000 mg | Freq: Four times a day (QID) | INTRAMUSCULAR | Status: DC | PRN

## 2013-03-07 MED ORDER — ONDANSETRON HCL 4 MG/2ML IJ SOLN
4.0000 mg | Freq: Four times a day (QID) | INTRAMUSCULAR | Status: DC | PRN
  Administered 2013-03-07: 4 mg via INTRAVENOUS

## 2013-03-07 NOTE — Progress Notes (Signed)
Pt arrived to floor room 1516 via stretcher. Alert and oriented w/ general weakness and pain 5/10. Since arrival n/v episode x2. VS taken, pt oriented to room. Initial assessment complete, will continue to monitor throughout shift.

## 2013-03-07 NOTE — Progress Notes (Signed)
Patient ID: Ricky Warren, male   DOB: October 20, 1975, 37 y.o.   MRN: [id number]  [facility] PROGRESS NOTE  Maclane Holloran HEN:[id number] DOB: 1975-12-11 DOA: 03/06/2013 PCP: [name], MD  Brief narrative: Pt is 36 yo male with history of UC that has been in remission for years, presented to [facility] ED with main concern of progressively worsening watery diarrhea, non bloody vomiting, poor oral intake. This has been associated with subjective fevers, chills, malaise. PT reports several kids in the family have similar symptoms and he thinks he got it from the exposure to kids at home.   Active Problems:   Intractable nausea and vomiting  - most likely secondary to viral GE - pt still vomiting - keep NPO and provide supportive care with IVF, analgesia and antiemetics as needed  - repeat BMP and CBC in AM   Dehydration - IVF as noted above as pt is now NPO  - Hg trending down and WNL indication no hemoconcentration and resolving dehydration  - CBC in AM   Gastroenteritis - management as noted above   Consultants:  None  Procedures/Studies:  None  Antibiotics:  None  Code Status: Full Family Communication: Pt at bedside Disposition Plan: Home when medically stable  HPI/Subjective: No events overnight.   Objective: Filed Vitals:   03/06/13 1940 03/07/13 0050 03/07/13 0105 03/07/13 0526  BP: 106/59 92/59 111/65 124/75  Pulse: 74 92 92 61  Temp: 99.3 F (37.4 C) 99.4 F (37.4 C) 99.1 F (37.3 C) 98.8 F (37.1 C)  TempSrc: Oral Oral Oral Oral  Resp: 18 20 17 18   SpO2: 99% 96% 99% 95%   No intake or output data in the 24 hours ending 03/07/13 0703  Exam:   General:  Pt is alert, follows commands appropriately, not in acute distress  Cardiovascular: Regular rate and rhythm, S1/S2, no murmurs, no rubs, no gallops  Respiratory: Clear to auscultation bilaterally, no wheezing, no crackles, no rhonchi  Abdomen: Soft, non tender, non distended, bowel sounds  present, no guarding  Extremities: No edema, pulses DP and PT palpable bilaterally  Neuro: Grossly nonfocal  Data Reviewed: Basic Metabolic Panel:  Recent Labs Lab 03/06/13 1553 03/07/13 0520  NA 140 137  K 4.4 4.0  CL 101 103  CO2 21 22  GLUCOSE 185* 128*  BUN 12 11  CREATININE 0.93 0.81  CALCIUM 10.1 8.8  MG  --  1.6  PHOS  --  3.2   Liver Function Tests:  Recent Labs Lab 03/06/13 1553 03/07/13 0520  AST 25 18  ALT 31 22  ALKPHOS 88 59  BILITOT 0.7 0.4  PROT 8.3 6.7  ALBUMIN 4.6 3.5    Recent Labs Lab 03/06/13 1553 03/07/13 0520  LIPASE 83* 11   CBC:  Recent Labs Lab 03/06/13 1553 03/07/13 0520  WBC 12.9* 10.7*  NEUTROABS 11.9*  --   HGB 17.1* 13.9  HCT 48.6 41.1  MCV 88.4 88.6  PLT 178 178   CBG:  Recent Labs Lab 03/07/13 0105  GLUCAP 126*    Scheduled Meds: . docusate sodium  100 mg Oral BID  . pantoprazole  40 mg Oral Daily   Continuous Infusions: . sodium chloride 150 mL/hr at 03/07/13 0221     [name], MD  [facility] Pager [phone]  If 7PM-7AM, please contact night-coverage [email or web address] Password [email or web address] 03/07/2013, 7:03 AM   LOS: 1 day

## 2013-03-07 NOTE — H&P (Signed)
PCP:  [name], MD    Chief Complaint:  Nausea vomiting diarrhea  HPI: Ricky Warren is a 37 y.o. male   has a past medical history of GERD (gastroesophageal reflux disease); Allergy; Chicken pox; Ulcerative colitis; Ulcerative proctosigmoiditis; and Intractable nausea and vomiting - episodic (08/07/2012).   Presented with  Patient is in town from [place] have been visiting family and have been exposed to young children with Gi symptoms. Today he developed nausea, vomiting and diarrhea. He is unable to keep PO. He have had low grade fever.  He have had some generalized abdominal discomfort. Patient have had increased lipase in the past with negative work up.  He is very uncomfortable. Reports being somewhat lightheaded after vomiting. Hospitalist called for admission.  Denies any blood in stool. He hs hx of UC but it has been in remission for years.   Review of Systems:    Pertinent positives include: chills, fatigue, abdominal pain, nausea, vomiting, diarrhea,   Constitutional:  No weight loss, night sweats, Fevers,  weight loss  HEENT:  No headaches, Difficulty swallowing,Tooth/dental problems,Sore throat,  No sneezing, itching, ear ache, nasal congestion, post nasal drip,  Cardio-vascular:  No chest pain, Orthopnea, PND, anasarca, dizziness, palpitations.no Bilateral lower extremity swelling  GI:  No heartburn, indigestion, change in bowel habits, loss of appetite, melena, blood in stool, hematemesis Resp:  no shortness of breath at rest. No dyspnea on exertion, No excess mucus, no productive cough, No non-productive cough, No coughing up of blood.No change in color of mucus.No wheezing. Skin:  no rash or lesions. No jaundice GU:  no dysuria, change in color of urine, no urgency or frequency. No straining to urinate.  No flank pain.  Musculoskeletal:  No joint pain or no joint swelling. No decreased range of motion. No back pain.  Psych:  No change in mood or  affect. No depression or anxiety. No memory loss.  Neuro: no localizing neurological complaints, no tingling, no weakness, no double vision, no gait abnormality, no slurred speech, no confusion  Otherwise ROS are negative except for above, 10 systems were reviewed  Past Medical History: Past Medical History  Diagnosis Date  . GERD (gastroesophageal reflux disease)   . Allergy   . Chicken pox   . Ulcerative colitis   . Ulcerative proctosigmoiditis   . Intractable nausea and vomiting - episodic 08/07/2012   Past Surgical History  Procedure Laterality Date  . Esophagogastroduodenoscopy      multiple  . Colonoscopy w/ biopsies      multiple     Medications: Prior to Admission medications   Medication Sig Start Date End Date Taking? Authorizing Provider  pantoprazole (PROTONIX) 40 MG tablet Take 40 mg by mouth daily.   Yes Historical Provider, MD    Allergies:   Allergies  Allergen Reactions  . Ciprofloxacin     unknown  . Doxycycline Nausea Only    GI upset, nausea    Social History:  Ambulatory  Independently  Lives at   Home in [place]   reports that he quit smoking about 11 years ago. His smoking use included Cigarettes. He has a 9 pack-year smoking history. He has never used smokeless tobacco. He reports that he does not drink alcohol or use illicit drugs.   Family History: family history includes Colon cancer in his maternal grandfather and another family member; Heart attack in his paternal uncle; Heart disease in his paternal grandfather; Stomach cancer in his maternal grandmother.    Physical  Exam: Patient Vitals for the past 24 hrs:  BP Temp Temp src Pulse Resp SpO2  03/06/13 1940 106/59 mmHg 99.3 F (37.4 C) Oral 74 18 99 %  03/06/13 1449 115/77 mmHg 98.4 F (36.9 C) Oral 72 18 97 %    1. General:  in No Acute distress 2. Psychological: Alert and  Oriented 3. Head/ENT:    Dry Mucous Membranes                          Head Non traumatic, neck  supple                          Normal   Dentition 4. SKIN:  decreased Skin turgor,  Skin clean Dry and intact no rash 5. Heart: Regular rate and rhythm no Murmur, Rub or gallop 6. Lungs: Clear to auscultation bilaterally, no wheezes or crackles   7. Abdomen: Soft, generalized tender, Non distended bowel sounds noted 8. Lower extremities: no clubbing, cyanosis, or edema 9. Neurologically Grossly intact, moving all 4 extremities equally 10. MSK: Normal range of motion  body mass index is unknown because there is no weight on file.   Labs on Admission:   Recent Labs  03/06/13 1553  NA 140  K 4.4  CL 101  CO2 21  GLUCOSE 185*  BUN 12  CREATININE 0.93  CALCIUM 10.1    Recent Labs  03/06/13 1553  AST 25  ALT 31  ALKPHOS 88  BILITOT 0.7  PROT 8.3  ALBUMIN 4.6    Recent Labs  03/06/13 1553  LIPASE 83*    Recent Labs  03/06/13 1553  WBC 12.9*  NEUTROABS 11.9*  HGB 17.1*  HCT 48.6  MCV 88.4  PLT 178   No results found for this basename: CKTOTAL, CKMB, CKMBINDEX, TROPONINI,  in the last 72 hours No results found for this basename: TSH, T4TOTAL, FREET3, T3FREE, THYROIDAB,  in the last 72 hours No results found for this basename: VITAMINB12, FOLATE, FERRITIN, TIBC, IRON, RETICCTPCT,  in the last 72 hours No results found for this basename: HGBA1C    The CrCl is unknown because both a height and weight (above a minimum accepted value) are required for this calculation. ABG    Component Value Date/Time   TCO2 18 08/06/2012 2257     Lab Results  Component Value Date   DDIMER  Value: 0.25        AT THE INHOUSE ESTABLISHED CUTOFF VALUE OF 0.48 ug/mL FEU, THIS ASSAY HAS BEEN DOCUMENTED IN THE LITERATURE TO HAVE A SENSITIVITY AND NEGATIVE PREDICTIVE VALUE OF AT LEAST 98 TO 99%.  THE TEST RESULT SHOULD BE CORRELATED WITH AN ASSESSMENT OF THE CLINICAL PROBABILITY OF DVT / VTE. 08/03/2010  lipase 83  Cultures: No results found for this basename: sdes, specrequest,  cult, reptstatus       Radiological Exams on Admission: No results found.  Chart has been reviewed  Assessment/Plan 37 year old gentleman history of ulcerative colitis presents with gastroenteritis likely viral and dehydration Present on Admission:  . Dehydration - patient does appear to be taken dehydrated and hemoconcentrated. Will give aggressive IV fluid resuscitation. Monitor electrolytes, check orthostatics in the morning prior to discharge . Intractable nausea and vomiting - likely secondary to gastroenteritis viral, treat with supportive care put him on to percussion  . Gastroenteritis - will obtain stool cultures likely viral in etiology, tissues history of ulcerative colitis but his  symptoms are not consistent Elevated lipase patient has history of this in the past no localizing symptoms which would be consistent with pancreatitis. Will follow and repeat in the morning   Prophylaxis: SCD  Protonix  CODE STATUS: FULL CODE  Other plan as per orders.  I have spent a total of 55 min on this admission  Ricky Warren 03/07/2013, 12:14 AM

## 2013-03-07 NOTE — ED Provider Notes (Signed)
Medical screening examination/treatment/procedure(s) were performed by non-physician practitioner and as supervising physician I was immediately available for consultation/collaboration.   Babette Relic, MD 03/07/13 [phone]

## 2013-03-08 LAB — CBC
HCT: 39.4 % (ref 39.0–52.0)
MCV: 89.7 fL (ref 78.0–100.0)
Platelets: 164 10*3/uL (ref 150–400)
RBC: 4.39 MIL/uL (ref 4.22–5.81)
WBC: 7.7 10*3/uL (ref 4.0–10.5)

## 2013-03-08 LAB — BASIC METABOLIC PANEL
BUN: 13 mg/dL (ref 6–23)
CO2: 25 mEq/L (ref 19–32)
Chloride: 107 mEq/L (ref 96–112)
Creatinine, Ser: 0.94 mg/dL (ref 0.50–1.35)
Glucose, Bld: 86 mg/dL (ref 70–99)

## 2013-03-08 MED ORDER — OXYCODONE-ACETAMINOPHEN 5-325 MG PO TABS: 1.0000 | ORAL_TABLET | ORAL | Status: DC | PRN

## 2013-03-08 MED ORDER — ONDANSETRON HCL 4 MG PO TABS: 4.0000 mg | ORAL_TABLET | Freq: Four times a day (QID) | ORAL | Status: DC | PRN

## 2013-03-08 MED ORDER — HYDROMORPHONE HCL PF 1 MG/ML IJ SOLN: 0.5000 mg | INTRAMUSCULAR | Status: DC | PRN

## 2013-03-08 NOTE — Progress Notes (Signed)
CSW received notification from RN that pt needing assistance with transportation home.  CSW met with pt at bedside. Pt discussed that his family is also sick and unable to provide transportation. CSW inquired with pt if pt had finances to pay for cab home. Pt stated that he had finances to pay for cab ride, but needed assistance arranging cab.  CSW arranged cab through [employer] for pt and pt plans to pay for cab ride.  RN aware.  No further social work needs identified.  CSW signing off.   Drake Leach, MSW, LCSW Clinical Social Work Coverage for [place],  [phone]

## 2013-03-08 NOTE — Progress Notes (Signed)
Patient ID: Ricky Warren, male   DOB: November 05, 1975, 37 y.o.   MRN: [id number] [facility] PROGRESS NOTE  Ricky Warren BOF:[id number] DOB: 12/13/1975 DOA: 03/06/2013 PCP: [name], MD  Brief narrative:  Pt is 37 yo male with history of UC that has been in remission for years, presented to [facility] ED with main concern of progressively worsening watery diarrhea, non bloody vomiting, poor oral intake. This has been associated with subjective fevers, chills, malaise. PT reports several kids in the family have similar symptoms and he thinks he got it from the exposure to kids at home.   Active Problems:  Intractable nausea and vomiting  - most likely secondary to viral GE  - pt had one episode this of vomiting this AM - BMP and CBC stable and WNL - advance diet as pt able to tolerate - likely d/c in AM Dehydration  - encouraged PO intake, d/c IVF  - Hg trending down and WNL indication no hemoconcentration and resolution of dehydration  Gastroenteritis  - management as noted above  - stool culture still pending   Consultants:  None Procedures/Studies:  None Antibiotics:  None  Code Status: Full  Family Communication: Pt at bedside  Disposition Plan: Home in AM   HPI/Subjective: No events overnight.   Objective: Filed Vitals:   03/07/13 1500 03/07/13 1815 03/07/13 2301 03/08/13 0609  BP: 112/72 112/74 110/63 112/69  Pulse: 72 76 57 58  Temp: 98.5 F (36.9 C) 98.8 F (37.1 C) 98.6 F (37 C) 99.5 F (37.5 C)  TempSrc: Oral Oral Oral Oral  Resp: 16 16 18 18   SpO2: 97% 98% 96% 98%    Intake/Output Summary (Last 24 hours) at 03/08/13 1254 Last data filed at 03/08/13 0700  Gross per 24 hour  Intake   1730 ml  Output      0 ml  Net   1730 ml    Exam:   General:  Pt is alert, follows commands appropriately, not in acute distress  Cardiovascular: Regular rate and rhythm, S1/S2, no murmurs, no rubs, no gallops  Respiratory: Clear to auscultation  bilaterally, no wheezing, no crackles, no rhonchi  Abdomen: Soft, non tender, non distended, bowel sounds present, no guarding  Extremities: No edema, pulses DP and PT palpable bilaterally  Neuro: Grossly nonfocal  Data Reviewed: Basic Metabolic Panel:  Recent Labs Lab 03/06/13 1553 03/07/13 0520 03/08/13 0530  NA 140 137 142  K 4.4 4.0 4.1  CL 101 103 107  CO2 21 22 25   GLUCOSE 185* 128* 86  BUN 12 11 13   CREATININE 0.93 0.81 0.94  CALCIUM 10.1 8.8 8.7  MG  --  1.6  --   PHOS  --  3.2  --    Liver Function Tests:  Recent Labs Lab 03/06/13 1553 03/07/13 0520  AST 25 18  ALT 31 22  ALKPHOS 88 59  BILITOT 0.7 0.4  PROT 8.3 6.7  ALBUMIN 4.6 3.5    Recent Labs Lab 03/06/13 1553 03/07/13 0520  LIPASE 83* 11   CBC:  Recent Labs Lab 03/06/13 1553 03/07/13 0520 03/08/13 0530  WBC 12.9* 10.7* 7.7  NEUTROABS 11.9*  --   --   HGB 17.1* 13.9 13.0  HCT 48.6 41.1 39.4  MCV 88.4 88.6 89.7  PLT 178 178 164   CBG:  Recent Labs Lab 03/07/13 0105  GLUCAP 126*    Scheduled Meds: . docusate sodium  100 mg Oral BID  . pantoprazole  40 mg Oral Daily  Continuous Infusions:   [name], MD  [facility] Pager [phone]  If 7PM-7AM, please contact night-coverage [email or web address] Password [email or web address] 03/08/2013, 12:54 PM   LOS: 2 days

## 2013-03-08 NOTE — Discharge Summary (Signed)
Physician Discharge Summary  Ricky Warren ZOX:[id number] DOB: 04-13-1975 DOA: 03/06/2013  PCP: [name], MD  Admit date: 03/06/2013 Discharge date: 03/08/2013  Recommendations for Outpatient Follow-up:  1. Pt will need to follow up with PCP in 2-3 weeks post discharge 2. Please obtain BMP to evaluate electrolytes and kidney function 3. Please also check CBC to evaluate Hg and Hct levels  Discharge Diagnoses: Viral gastroenteritis  Active Problems:   Intractable nausea and vomiting - episodic   Dehydration   Gastroenteritis    Discharge Condition: Stable  Diet recommendation: Heart healthy diet discussed in details   Brief narrative:  Pt is 37 yo male with history of UC that has been in remission for years, presented to [facility] ED with main concern of progressively worsening watery diarrhea, non bloody vomiting, poor oral intake. This has been associated with subjective fevers, chills, malaise. PT reports several kids in the family have similar symptoms and he thinks he got it from the exposure to kids at home.   Active Problems:  Intractable nausea and vomiting  - most likely secondary to viral GE  - stool culture negative to date - no vomiting reported over 24 hours - pt tolerating current diet well  - BMP and CBC stable and WNL  Dehydration  - encouraged PO intake, d/c IVF  - Hg trending down and WNL indication no hemoconcentration and resolution of dehydration  Gastroenteritis  - management as noted above  - stool culture negative to date   Consultants:  None Procedures/Studies:  None Antibiotics:  None  Code Status: Full  Family Communication: Pt at bedside   Discharge Exam: Filed Vitals:   03/08/13 0609  BP: 112/69  Pulse: 58  Temp: 99.5 F (37.5 C)  Resp: 18   Filed Vitals:   03/07/13 1500 03/07/13 1815 03/07/13 2301 03/08/13 0609  BP: 112/72 112/74 110/63 112/69  Pulse: 72 76 57 58  Temp: 98.5 F (36.9 C) 98.8 F (37.1 C) 98.6 F (37  C) 99.5 F (37.5 C)  TempSrc: Oral Oral Oral Oral  Resp: 16 16 18 18   SpO2: 97% 98% 96% 98%    General: Pt is alert, follows commands appropriately, not in acute distress Cardiovascular: Regular rate and rhythm, S1/S2 +, no murmurs, no rubs, no gallops Respiratory: Clear to auscultation bilaterally, no wheezing, no crackles, no rhonchi Abdominal: Soft, non tender, non distended, bowel sounds +, no guarding Extremities: no edema, no cyanosis, pulses palpable bilaterally DP and PT Neuro: Grossly nonfocal  Discharge Instructions  Discharge Orders   Future Orders Complete By Expires   Diet - low sodium heart healthy  As directed    Increase activity slowly  As directed        Medication List         ondansetron 4 MG tablet  Commonly known as:  ZOFRAN  Take 1 tablet (4 mg total) by mouth every 6 (six) hours as needed for nausea.     oxyCODONE-acetaminophen 5-325 MG per tablet  Commonly known as:  PERCOCET/ROXICET  Take 1 tablet by mouth every 4 (four) hours as needed for moderate pain or severe pain.     pantoprazole 40 MG tablet  Commonly known as:  PROTONIX  Take 40 mg by mouth daily.           Follow-up Information   Follow up with [name], MD In 2 weeks.   Specialty:  Family Medicine   Contact information:   [place] [address]  [phone]       Follow up with [name], MD. (If symptoms worsen call my cell phone [phone])    Specialty:  Internal Medicine   Contact information:   [address] [place] [address] [phone]        The results of significant diagnostics from this hospitalization (including imaging, microbiology, ancillary and laboratory) are listed below for reference.     Microbiology: No results found for this or any previous visit (from the past 240 hour(s)).   Labs: Basic Metabolic Panel:  Recent Labs Lab 03/06/13 1553 03/07/13 0520 03/08/13 0530  NA 140 137 142  K  4.4 4.0 4.1  CL 101 103 107  CO2 21 22 25   GLUCOSE 185* 128* 86  BUN 12 11 13   CREATININE 0.93 0.81 0.94  CALCIUM 10.1 8.8 8.7  MG  --  1.6  --   PHOS  --  3.2  --    Liver Function Tests:  Recent Labs Lab 03/06/13 1553 03/07/13 0520  AST 25 18  ALT 31 22  ALKPHOS 88 59  BILITOT 0.7 0.4  PROT 8.3 6.7  ALBUMIN 4.6 3.5    Recent Labs Lab 03/06/13 1553 03/07/13 0520  LIPASE 83* 11   CBC:  Recent Labs Lab 03/06/13 1553 03/07/13 0520 03/08/13 0530  WBC 12.9* 10.7* 7.7  NEUTROABS 11.9*  --   --   HGB 17.1* 13.9 13.0  HCT 48.6 41.1 39.4  MCV 88.4 88.6 89.7  PLT 178 178 164   CBG:  Recent Labs Lab 03/07/13 0105  GLUCAP 126*     SIGNED: Time coordinating discharge: Over 30 minutes  [name], MD  [facility] 03/08/2013, 2:47 PM Pager [phone]  If 7PM-7AM, please contact night-coverage [email or web address] Password [email or web address]

## 2013-03-08 NOTE — Progress Notes (Signed)
INITIAL NUTRITION ASSESSMENT  DOCUMENTATION CODES Per approved criteria  -Not Applicable   INTERVENTION: - Diet advancement per MD - Will continue to monitor   NUTRITION DIAGNOSIS: Inadequate oral intake related to inability to eat as evidenced by NPO.   Goal: Advance diet as tolerated to regular diet  Monitor:  Weights, labs, diet advancement, nausea/vomiting  Reason for Assessment: Malnutrition screening tool   37 y.o. male  Admitting Dx: nausea, vomiting, diarrhea  ASSESSMENT: Patient is in town from [place] have been visiting family and have been exposed to young children with GI symptoms. On Sunday he developed nausea, vomiting and diarrhea. He is unable to keep PO. He had a low grade fever. He have had some generalized abdominal discomfort.   Met with pt who reports not being able to eat anything since Sunday. States before then he was eating well 2 meals/day with good appetite. Denies any changes in weight. Denies any nausea/vomiting today. Reports this is the 3rd time this year he has had nausea/vomting for a few days. States this happened in April and May of this year. He was told that it was a viral infection.    Height: Ht Readings from Last 1 Encounters:  11/22/12 6' 2" (1.88 m)    Weight: 03/08/13         20 4 lb (92.7 kg) per pt  Ideal Body Weight: 190 lb   % Ideal Body Weight: 107%  Wt Readings from Last 10 Encounters:  12/20/12 206 lb (93.441 kg)  11/22/12 209 lb (94.802 kg)  08/12/12 226 lb (102.513 kg)  08/07/12 227 lb (102.967 kg)  07/19/12 232 lb (105.235 kg)  09/18/11 233 lb (105.688 kg)  12/06/10 202 lb (91.627 kg)  11/12/10 200 lb (90.719 kg)  10/22/10 198 lb (89.812 kg)    Usual Body Weight: 204 lb   % Usual Body Weight: 100%  BMI:  26.2 kg/(m^2) based on pt's reported weight of 204 pounds  Estimated Nutritional Needs: Kcal: 2300-2500 Protein: 115-130g Fluid: 2.3-2.5L/day   Skin: Intact   Diet Order: NPO  EDUCATION  NEEDS: -No education needs identified at this time   Intake/Output Summary (Last 24 hours) at 03/08/13 1240 Last data filed at 03/08/13 0700  Gross per 24 hour  Intake   1730 ml  Output      0 ml  Net   1730 ml    Last BM: 12/29  Labs:   Recent Labs Lab 03/06/13 1553 03/07/13 0520 03/08/13 0530  NA 140 137 142  K 4.4 4.0 4.1  CL 101 103 107  CO2 21 22 25   BUN 12 11 13   CREATININE 0.93 0.81 0.94  CALCIUM 10.1 8.8 8.7  MG  --  1.6  --   PHOS  --  3.2  --   GLUCOSE 185* 128* 86    CBG (last 3)   Recent Labs  03/07/13 0105  GLUCAP 126*    Scheduled Meds: . docusate sodium  100 mg Oral BID  . pantoprazole  40 mg Oral Daily    Continuous Infusions:   Past Medical History  Diagnosis Date  . GERD (gastroesophageal reflux disease)   . Allergy   . Chicken pox   . Ulcerative colitis   . Ulcerative proctosigmoiditis   . Intractable nausea and vomiting - episodic 08/07/2012    Past Surgical History  Procedure Laterality Date  . Esophagogastroduodenoscopy      multiple  . Colonoscopy w/ biopsies      multiple  Mikey College MS, [place], [place] Pager [phone] After Hours Pager

## 2013-03-09 NOTE — Progress Notes (Signed)
Discharge summary sent to payer through [facility]  

## 2013-07-17 ENCOUNTER — Observation Stay (HOSPITAL_COMMUNITY)
Admission: EM | Admit: 2013-07-17 | Discharge: 2013-07-18 | Disposition: A | Discharge status: Home / Self Care | Payer: BC Managed Care – PPO | Attending: Internal Medicine | Admitting: Internal Medicine

## 2013-07-17 ENCOUNTER — Encounter (HOSPITAL_COMMUNITY): Payer: Self-pay | Admitting: Emergency Medicine

## 2013-07-17 DIAGNOSIS — E860 Dehydration: Secondary | ICD-10-CM | POA: Insufficient documentation

## 2013-07-17 DIAGNOSIS — R109 Unspecified abdominal pain: Secondary | ICD-10-CM | POA: Insufficient documentation

## 2013-07-17 DIAGNOSIS — Z87891 Personal history of nicotine dependence: Secondary | ICD-10-CM | POA: Insufficient documentation

## 2013-07-17 DIAGNOSIS — K5130 Ulcerative (chronic) rectosigmoiditis without complications: Secondary | ICD-10-CM

## 2013-07-17 DIAGNOSIS — K219 Gastro-esophageal reflux disease without esophagitis: Secondary | ICD-10-CM | POA: Insufficient documentation

## 2013-07-17 DIAGNOSIS — E876 Hypokalemia: Secondary | ICD-10-CM | POA: Insufficient documentation

## 2013-07-17 DIAGNOSIS — R1110 Vomiting, unspecified: Secondary | ICD-10-CM | POA: Diagnosis present

## 2013-07-17 DIAGNOSIS — Z800 Family history of malignant neoplasm of digestive organs: Secondary | ICD-10-CM | POA: Insufficient documentation

## 2013-07-17 DIAGNOSIS — K5190 Ulcerative colitis, unspecified, without complications: Secondary | ICD-10-CM | POA: Insufficient documentation

## 2013-07-17 DIAGNOSIS — R112 Nausea with vomiting, unspecified: Principal | ICD-10-CM | POA: Insufficient documentation

## 2013-07-17 DIAGNOSIS — R1115 Cyclical vomiting syndrome unrelated to migraine: Secondary | ICD-10-CM

## 2013-07-17 LAB — COMPREHENSIVE METABOLIC PANEL
ALBUMIN: 4.6 g/dL (ref 3.5–5.2)
ALK PHOS: 75 U/L (ref 39–117)
ALT: 18 U/L (ref 0–53)
AST: 22 U/L (ref 0–37)
BUN: 9 mg/dL (ref 6–23)
CO2: 26 mEq/L (ref 19–32)
CREATININE: 0.97 mg/dL (ref 0.50–1.35)
Calcium: 9.6 mg/dL (ref 8.4–10.5)
Chloride: 100 mEq/L (ref 96–112)
GFR calc Af Amer: >90 mL/min (ref >90)
GFR calc non Af Amer: >90 mL/min (ref >90)
Glucose, Bld: 133 mg/dL — ABNORMAL HIGH (ref 70–99)
POTASSIUM: 3.2 meq/L — AB (ref 3.7–5.3)
Sodium: 141 mEq/L (ref 137–147)
Total Bilirubin: 0.4 mg/dL (ref 0.3–1.2)
Total Protein: 7.9 g/dL (ref 6.0–8.3)

## 2013-07-17 LAB — CBC WITH DIFFERENTIAL/PLATELET
Basophils Absolute: 0.1 10*3/uL (ref 0.0–0.1)
Basophils Relative: 1 % (ref 0–1)
EOS PCT: 6 % — AB (ref 0–5)
Eosinophils Absolute: 0.4 10*3/uL (ref 0.0–0.7)
HCT: 44.1 % (ref 39.0–52.0)
Hemoglobin: 15.4 g/dL (ref 13.0–17.0)
LYMPHS ABS: 2.9 10*3/uL (ref 0.7–4.0)
LYMPHS PCT: 39 % (ref 12–46)
MCH: 30.4 pg (ref 26.0–34.0)
MCHC: 34.9 g/dL (ref 30.0–36.0)
MCV: 87 fL (ref 78.0–100.0)
Monocytes Absolute: 0.5 10*3/uL (ref 0.1–1.0)
Monocytes Relative: 7 % (ref 3–12)
Neutro Abs: 3.5 10*3/uL (ref 1.7–7.7)
Neutrophils Relative %: 47 % (ref 43–77)
Platelets: 233 10*3/uL (ref 150–400)
RBC: 5.07 MIL/uL (ref 4.22–5.81)
RDW: 12.5 % (ref 11.5–15.5)
WBC: 7.4 10*3/uL (ref 4.0–10.5)

## 2013-07-17 LAB — RAPID URINE DRUG SCREEN, HOSP PERFORMED
AMPHETAMINES: NOT DETECTED
BARBITURATES: NOT DETECTED
BENZODIAZEPINES: NOT DETECTED
Cocaine: NOT DETECTED
Opiates: POSITIVE — AB
Tetrahydrocannabinol: POSITIVE — AB

## 2013-07-17 LAB — URINE MICROSCOPIC-ADD ON

## 2013-07-17 LAB — URINALYSIS, ROUTINE W REFLEX MICROSCOPIC
BILIRUBIN URINE: NEGATIVE
GLUCOSE, UA: NEGATIVE mg/dL
HGB URINE DIPSTICK: NEGATIVE
Leukocytes, UA: NEGATIVE
Nitrite: NEGATIVE
PH: 7.5 (ref 5.0–8.0)
Protein, ur: 30 mg/dL — AB
Specific Gravity, Urine: 1.028 (ref 1.005–1.030)
Urobilinogen, UA: 1 mg/dL (ref 0.0–1.0)

## 2013-07-17 LAB — POC OCCULT BLOOD, ED: Fecal Occult Bld: NEGATIVE

## 2013-07-17 LAB — ETHANOL

## 2013-07-17 MED ORDER — ONDANSETRON HCL 4 MG/2ML IJ SOLN
4.0000 mg | Freq: Once | INTRAMUSCULAR | Status: AC
  Administered 2013-07-17: 4 mg via INTRAVENOUS
  Filled 2013-07-17: qty 2

## 2013-07-17 MED ORDER — ONDANSETRON HCL 4 MG/2ML IJ SOLN: 4.0000 mg | Freq: Three times a day (TID) | INTRAMUSCULAR | Status: AC | PRN

## 2013-07-17 MED ORDER — ACETAMINOPHEN 325 MG PO TABS: 650.0000 mg | ORAL_TABLET | Freq: Four times a day (QID) | ORAL | Status: DC | PRN

## 2013-07-17 MED ORDER — SODIUM CHLORIDE 0.9 % IV SOLN
INTRAVENOUS | Status: DC
  Administered 2013-07-17 – 2013-07-18 (×2): via INTRAVENOUS

## 2013-07-17 MED ORDER — HYDROMORPHONE HCL PF 1 MG/ML IJ SOLN
1.0000 mg | Freq: Once | INTRAMUSCULAR | Status: AC
  Administered 2013-07-17: 1 mg via INTRAVENOUS
  Filled 2013-07-17: qty 1

## 2013-07-17 MED ORDER — SODIUM CHLORIDE 0.9 % IV SOLN
INTRAVENOUS | Status: DC
  Administered 2013-07-17: 20:00:00 via INTRAVENOUS

## 2013-07-17 MED ORDER — POTASSIUM CHLORIDE 10 MEQ/100ML IV SOLN
10.0000 meq | Freq: Once | INTRAVENOUS | Status: AC
Start: 2013-07-17 — End: 2013-07-17
  Administered 2013-07-17: 10 meq via INTRAVENOUS
  Filled 2013-07-17: qty 100

## 2013-07-17 MED ORDER — DICYCLOMINE HCL 10 MG/ML IM SOLN
20.0000 mg | Freq: Once | INTRAMUSCULAR | Status: AC
  Administered 2013-07-17: 20 mg via INTRAMUSCULAR
  Filled 2013-07-17: qty 2

## 2013-07-17 MED ORDER — SODIUM CHLORIDE 0.9 % IV BOLUS (SEPSIS)
1000.0000 mL | Freq: Once | INTRAVENOUS | Status: AC
  Administered 2013-07-17: 1000 mL via INTRAVENOUS

## 2013-07-17 MED ORDER — MORPHINE SULFATE 4 MG/ML IJ SOLN
6.0000 mg | Freq: Once | INTRAMUSCULAR | Status: AC
  Administered 2013-07-17: 6 mg via INTRAVENOUS
  Filled 2013-07-17: qty 2

## 2013-07-17 MED ORDER — PROMETHAZINE HCL 25 MG/ML IJ SOLN
12.5000 mg | Freq: Once | INTRAMUSCULAR | Status: AC
  Administered 2013-07-17: 12.5 mg via INTRAVENOUS
  Filled 2013-07-17: qty 1

## 2013-07-17 MED ORDER — HYDROMORPHONE HCL PF 1 MG/ML IJ SOLN
1.0000 mg | INTRAMUSCULAR | Status: AC | PRN
  Administered 2013-07-18: 1 mg via INTRAVENOUS
  Filled 2013-07-17: qty 1

## 2013-07-17 MED ORDER — ACETAMINOPHEN 650 MG RE SUPP: 650.0000 mg | Freq: Four times a day (QID) | RECTAL | Status: DC | PRN

## 2013-07-17 MED ORDER — POTASSIUM CHLORIDE CRYS ER 20 MEQ PO TBCR
20.0000 meq | EXTENDED_RELEASE_TABLET | Freq: Once | ORAL | Status: AC
  Administered 2013-07-17: 20 meq via ORAL
  Filled 2013-07-17: qty 1

## 2013-07-17 MED ORDER — PANTOPRAZOLE SODIUM 40 MG IV SOLR
40.0000 mg | INTRAVENOUS | Status: DC
  Administered 2013-07-17: 40 mg via INTRAVENOUS
  Filled 2013-07-17 (×2): qty 40

## 2013-07-17 MED ORDER — METOCLOPRAMIDE HCL 5 MG/ML IJ SOLN
10.0000 mg | Freq: Once | INTRAMUSCULAR | Status: AC
  Administered 2013-07-17: 10 mg via INTRAVENOUS
  Filled 2013-07-17: qty 2

## 2013-07-17 NOTE — ED Notes (Signed)
Pt asleep. Woke pt up to ask him to use urinal pt expressed understanding and went back to sleep

## 2013-07-17 NOTE — ED Notes (Signed)
Patient requested to have potassium broke into. Patient put 1/2 of tablet in mouth, some melted and a small piece he spit out. Will attempt to give the other half later on.

## 2013-07-17 NOTE — ED Notes (Signed)
Pt given urinal and made aware of need for urine specimen unable to void at this time

## 2013-07-17 NOTE — ED Provider Notes (Signed)
CSN: [id number]     Arrival date & time 07/17/13  1444 History   First MD Initiated Contact with Patient 07/17/13 1458     Chief Complaint  Patient presents with  . Abdominal Pain  . Emesis     (Consider location/radiation/quality/duration/timing/severity/associated sxs/prior Treatment) HPI  38 year old male with history of ulcerative colitis, recurrent abdominal pain, GERD, presents complaining of persistent vomiting. Patient states since noon today he has had persistent nausea, vomiting more than 10 times. Vomitus is nonbloody and nonbilious, and breaking out in sweats. Symptom is persistent, unable to keep anything down. Reports chills. Denies fever, headache, productive cough, chest pain, shortness of breath, back pain, dysuria, blood in stools and, or rash. Last bowel movement was 2 days ago. Able to have flatus. No prior abdominal surgery. He reports having similar symptoms in the past that usually lasted for about 48 hours. No recent sick contacts, not eating any exotic food. The symptom does not feel like his ulcer colitis which has been in remission for many years. Denies any recent alcohol use.  Past Medical History  Diagnosis Date  . GERD (gastroesophageal reflux disease)   . Allergy   . Chicken pox   . Ulcerative colitis   . Ulcerative proctosigmoiditis   . Intractable nausea and vomiting - episodic 08/07/2012   Past Surgical History  Procedure Laterality Date  . Esophagogastroduodenoscopy      multiple  . Colonoscopy w/ biopsies      multiple   Family History  Problem Relation Age of Onset  . Colon cancer Maternal Grandfather     possible  . Colon cancer      great mat. GF, possible  . Heart disease Paternal Grandfather   . Stomach cancer Maternal Grandmother   . Heart attack Paternal Uncle    History  Substance Use Topics  . Smoking status: Former Smoker -- 1.00 packs/day for 9 years    Types: Cigarettes    Quit date: 10/21/2001  . Smokeless tobacco: Never  Used  . Alcohol Use: No    Review of Systems  All other systems reviewed and are negative.     Allergies  Ciprofloxacin and Doxycycline  Home Medications   Prior to Admission medications   Medication Sig Start Date End Date Taking? Authorizing Provider  ondansetron (ZOFRAN) 4 MG tablet Take 1 tablet (4 mg total) by mouth every 6 (six) hours as needed for nausea. 03/08/13   Theodis Blaze, MD  oxyCODONE-acetaminophen (PERCOCET/ROXICET) 5-325 MG per tablet Take 1 tablet by mouth every 4 (four) hours as needed for moderate pain or severe pain. 03/08/13   Theodis Blaze, MD  pantoprazole (PROTONIX) 40 MG tablet Take 40 mg by mouth daily.    Historical Provider, MD   BP 106/64  Pulse 88  Temp(Src) 97.9 F (36.6 C) (Oral)  Resp 18  SpO2 100% Physical Exam  Constitutional: He appears well-developed and well-nourished. He appears distressed (patient is sitting upright, sweating profusely, appears to be uncomfortable).  HENT:  Head: Atraumatic.  Mouth/Throat: Oropharynx is clear and moist.  Eyes: Conjunctivae are normal.  Neck: Normal range of motion. Neck supple.  Cardiovascular: Normal rate and regular rhythm.   Pulmonary/Chest: Effort normal and breath sounds normal.  Abdominal: There is tenderness (Abdomen is soft, mild tenderness to epigastrium without guarding or rebound tenderness.).  Neurological: He is alert.  Skin: No rash noted.  Skin is cool and clammy.  Psychiatric: He has a normal mood and affect.    ED  Course  Procedures (including critical care time)  Patient presents with persistent nausea vomiting. Able to pass flatus, he denies any significant constipation. He reports having similar symptoms in the past. Plan for symptomatically control, will check Hemoccult. Care discussed with Dr. Jeneen Rinks  4:19 PM Minimal improvement of symptoms after receiving morphine, and Zofran. We'll give Dilaudid and Reglan and will continue IV fluid. Aside from mildly decreased  potassium of 3.2, his labs otherwise reassuring. Hemoccult negative. Potassium supplementation given.  7:35 PM Patient has vomited last however unable to keep by mouth. On reexamination his abdomen is soft although diffuse tender. I still have low suspicion for biliary etiology, or ulcerative colitis. Low suspicion for bowel perforation. I of the patient the option of going home versus staying in patient prefers to be admitted because he is afraid that his symptoms will not be well controlled at home. He does have >80 ketone in urine to reflect dehydration.    7:49 PM I have consulted with [facility] Dr. Charlies Silvers who agrees to admit pt to obs, med surg, team 8, under her care.    Labs Review Labs Reviewed  COMPREHENSIVE METABOLIC PANEL - Abnormal; Notable for the following:    Potassium 3.2 (*)    Glucose, Bld 133 (*)    All other components within normal limits  CBC WITH DIFFERENTIAL - Abnormal; Notable for the following:    Eosinophils Relative 6 (*)    All other components within normal limits  URINALYSIS, ROUTINE W REFLEX MICROSCOPIC - Abnormal; Notable for the following:    Color, Urine AMBER (*)    Ketones, ur >80 (*)    Protein, ur 30 (*)    All other components within normal limits  URINE MICROSCOPIC-ADD ON  POC OCCULT BLOOD, ED    Imaging Review No results found.   EKG Interpretation None      MDM   Final diagnoses:  Dehydration  Emesis, persistent    BP 123/84  Pulse 64  Temp(Src) 97.9 F (36.6 C) (Oral)  Resp 12  SpO2 99%  I have reviewed nursing notes and vital signs.  I reviewed available ER/hospitalization records thought the EMR     Domenic Moras, [place] 07/17/13 1950

## 2013-07-17 NOTE — H&P (Signed)
[facility] History and Physical  Carmon Sahli IOM:[id number] DOB: 1975-04-10 DOA: 07/17/2013  Referring physician: ER physician PCP: [name], MD   Chief Complaint: nausea and vomiting  HPI:  38 year old male with past medical history of ulcerative colitis, GERD who presented to [facility] ED 07/18/2103 with intractable nausea and vomiting started on the morning of the admission. Patient reported no associated fevers or chills. He did have diffuse and cramp like abdominal pain mostly in epigastric area but then also radiating to the mid abdomen. Pain was 7/10 in intensity. Pain was somewhat relieved with analgesia in ED. No reports of diarrhea or blood in stool or urine. No other complaints of chest pain, shortness of breath, palpitations. No lightheadedness or loss of consciousness.   In ED, vitals are stable. He continued to have nausea and vomiting in ED. He had few rounds of antiemetics in ED. He was however given quite a few doses of narcotics in ED including dilaudid IV 1 mg 2 times and then total of 6 mg of IV morphine which may have exacerbated nausea and vomiting. He was started on IV fluids. His potassium was 3.2 on admission and was repleted in ED.  Assessment and Plan:  Principal Problem:   Intractable nausea and vomiting - unclear etiology, possible viral gastroenteritis - we will continue supportive care with IV fluids, analgesia if needed and antiemetics if needed - NPO for now - continue protonix 40 mg IV daily Active Problems:   Hypokalemia - secondary to GI losses - repleted with total of 30 meq potassium in ED - follow up BMP in am   Radiological Exams on Admission: No results found.   Code Status: Full Family Communication: Pt at bedside Disposition Plan: Admit for further evaluation  Robbie Lis, MD  [facility] Pager [phone]  Review of Systems:  Constitutional: Negative for fever, chills and malaise/fatigue. Negative for  diaphoresis.  HENT: Negative for hearing loss, ear pain, nosebleeds, congestion, sore throat, neck pain, tinnitus and ear discharge.   Eyes: Negative for blurred vision, double vision, photophobia, pain, discharge and redness.  Respiratory: Negative for cough, hemoptysis, sputum production, shortness of breath, wheezing and stridor.   Cardiovascular: Negative for chest pain, palpitations, orthopnea, claudication and leg swelling.  Gastrointestinal: per HPI.  Genitourinary: Negative for dysuria, urgency, frequency, hematuria and flank pain.  Musculoskeletal: Negative for myalgias, back pain, joint pain and falls.  Skin: Negative for itching and rash.  Neurological: Negative for dizziness and weakness. Negative for tingling, tremors, sensory change, speech change, focal weakness, loss of consciousness and headaches.  Endo/Heme/Allergies: Negative for environmental allergies and polydipsia. Does not bruise/bleed easily.  Psychiatric/Behavioral: Negative for suicidal ideas. The patient is not nervous/anxious.      Past Medical History  Diagnosis Date  . GERD (gastroesophageal reflux disease)   . Allergy   . Chicken pox   . Ulcerative colitis   . Ulcerative proctosigmoiditis   . Intractable nausea and vomiting - episodic 08/07/2012   Past Surgical History  Procedure Laterality Date  . Esophagogastroduodenoscopy      multiple  . Colonoscopy w/ biopsies      multiple   Social History:  reports that he quit smoking about 11 years ago. His smoking use included Cigarettes. He has a 9 pack-year smoking history. He has never used smokeless tobacco. He reports that he does not drink alcohol or use illicit drugs.  Allergies  Allergen Reactions  . Ciprofloxacin     unknown  . Doxycycline  Nausea Only    GI upset, nausea    Family History  Problem Relation Age of Onset  . Colon cancer Maternal Grandfather     possible  . Colon cancer      great mat. GF, possible  . Heart disease Paternal  Grandfather   . Stomach cancer Maternal Grandmother   . Heart attack Paternal Uncle      Prior to Admission medications   Medication Sig Start Date End Date Taking? Authorizing Provider  Multiple Vitamins-Minerals (ADULT GUMMY) CHEW Chew 2 tablets by mouth daily.   Yes Historical Provider, MD   Physical Exam: Filed Vitals:   07/17/13 1740 07/17/13 1800 07/17/13 1820 07/17/13 1900  BP: 114/88 117/70 126/86 123/84  Pulse: 59 66 71 64  Temp:      TempSrc:      Resp:      SpO2: 95% 100% 99% 99%    Physical Exam  Constitutional: Appears well-developed and well-nourished. No acute distress.  HENT: Normocephalic. External right and left ear normal. Oropharynx is clear and moist.  Eyes: Conjunctivae and EOM are normal. PERRLA, no scleral icterus.  Neck: Normal ROM. Neck supple. No JVD. No tracheal deviation. No thyromegaly.  CVS: RRR, S1/S2 +, no murmurs, no gallops, no carotid bruit.  Pulmonary: Effort and breath sounds normal, no stridor, rhonchi, wheezes, rales.  Abdominal: Soft. BS +,  no distension, tenderness across mid abdomen, no rebound or guarding.  Musculoskeletal: Normal range of motion. No edema and no tenderness.  Lymphadenopathy: No lymphadenopathy noted, cervical, inguinal. Neuro: Alert. Normal reflexes, muscle tone coordination. No cranial nerve deficit. Skin: Skin is warm and dry. No rash noted. Not diaphoretic. No erythema. No pallor.  Psychiatric: Normal mood and affect. Behavior, judgment, thought content normal.   Labs on Admission:  Basic Metabolic Panel:  Recent Labs Lab 07/17/13 1506  NA 141  K 3.2*  CL 100  CO2 26  GLUCOSE 133*  BUN 9  CREATININE 0.97  CALCIUM 9.6   Liver Function Tests:  Recent Labs Lab 07/17/13 1506  AST 22  ALT 18  ALKPHOS 75  BILITOT 0.4  PROT 7.9  ALBUMIN 4.6   No results found for this basename: LIPASE, AMYLASE,  in the last 168 hours No results found for this basename: AMMONIA,  in the last 168  hours CBC:  Recent Labs Lab 07/17/13 1506  WBC 7.4  NEUTROABS 3.5  HGB 15.4  HCT 44.1  MCV 87.0  PLT 233   Cardiac Enzymes: No results found for this basename: CKTOTAL, CKMB, CKMBINDEX, TROPONINI,  in the last 168 hours BNP: No components found with this basename: POCBNP,  CBG: No results found for this basename: GLUCAP,  in the last 168 hours  If 7PM-7AM, please contact night-coverage [email or web address] Password [email or web address] 07/17/2013, 8:01 PM

## 2013-07-17 NOTE — ED Notes (Signed)
Pt attempting to provide urine specimen

## 2013-07-17 NOTE — ED Notes (Signed)
Pt states he gets this often. Labeled a stomach virus.  Started today at noon with abdominal pain/n/v.  Pt actively vomiting in triage.

## 2013-07-17 NOTE — ED Notes (Signed)
Pt reminded of need for urine specimen

## 2013-07-17 NOTE — ED Notes (Signed)
Blood ethanol and urine drug results pending

## 2013-07-18 LAB — CBC
HEMATOCRIT: 37.4 % — AB (ref 39.0–52.0)
HEMOGLOBIN: 12.9 g/dL — AB (ref 13.0–17.0)
MCH: 30 pg (ref 26.0–34.0)
MCHC: 34.5 g/dL (ref 30.0–36.0)
MCV: 87 fL (ref 78.0–100.0)
Platelets: 184 10*3/uL (ref 150–400)
RBC: 4.3 MIL/uL (ref 4.22–5.81)
RDW: 12.7 % (ref 11.5–15.5)
WBC: 7.5 10*3/uL (ref 4.0–10.5)

## 2013-07-18 LAB — COMPREHENSIVE METABOLIC PANEL
ALBUMIN: 3.7 g/dL (ref 3.5–5.2)
ALT: 14 U/L (ref 0–53)
AST: 18 U/L (ref 0–37)
Alkaline Phosphatase: 58 U/L (ref 39–117)
BUN: 8 mg/dL (ref 6–23)
CALCIUM: 8.8 mg/dL (ref 8.4–10.5)
CO2: 23 mEq/L (ref 19–32)
Chloride: 106 mEq/L (ref 96–112)
Creatinine, Ser: 0.83 mg/dL (ref 0.50–1.35)
GFR calc Af Amer: >90 mL/min (ref >90)
GFR calc non Af Amer: >90 mL/min (ref >90)
Glucose, Bld: 109 mg/dL — ABNORMAL HIGH (ref 70–99)
Potassium: 4.1 mEq/L (ref 3.7–5.3)
Sodium: 139 mEq/L (ref 137–147)
Total Bilirubin: 0.4 mg/dL (ref 0.3–1.2)
Total Protein: 6.2 g/dL (ref 6.0–8.3)

## 2013-07-18 LAB — LIPASE, BLOOD: Lipase: 34 U/L (ref 11–59)

## 2013-07-18 MED ORDER — HYDROMORPHONE HCL PF 1 MG/ML IJ SOLN: 1.0000 mg | INTRAMUSCULAR | Status: DC | PRN

## 2013-07-18 MED ORDER — ONDANSETRON 8 MG PO TBDP: 4.0000 mg | ORAL_TABLET | Freq: Three times a day (TID) | ORAL | Status: DC | PRN

## 2013-07-18 MED ORDER — NORCO 5-325 MG PO TABS: 1.0000 | ORAL_TABLET | Freq: Four times a day (QID) | ORAL | Status: DC | PRN

## 2013-07-18 MED ORDER — PANTOPRAZOLE SODIUM 40 MG PO TBEC: 40.0000 mg | DELAYED_RELEASE_TABLET | Freq: Every day | ORAL | Status: DC

## 2013-07-18 MED ORDER — ONDANSETRON HCL 4 MG/2ML IJ SOLN
4.0000 mg | Freq: Three times a day (TID) | INTRAMUSCULAR | Status: DC | PRN
Start: 2013-07-18 — End: 2016-10-06

## 2013-07-18 NOTE — Discharge Summary (Signed)
Physician Discharge Summary  Ricky Warren ZHG:[id number] DOB: Apr 08, 1975 DOA: 07/17/2013  PCP: [name], MD  Admit date: 07/17/2013 Discharge date: 07/18/2013  Time spent: >35 minutes  Recommendations for Outpatient Follow-up:  1. Follow up with PCP in 1 week 2. Follow up with GI as needed  Discharge Diagnoses:  Principal Problem:   Intractable nausea and vomiting Active Problems:   Hypokalemia  Discharge Condition: stable  Diet recommendation: regular  Filed Weights   07/17/13 2104  Weight: 95.2 kg (209 lb 14.1 oz)   History of present illness:  38 year old male with past medical history of ulcerative colitis, GERD who presented to [facility] ED 07/18/2103 with intractable nausea and vomiting started on the morning of the admission. Patient reported no associated fevers or chills. He did have diffuse and cramp like abdominal pain mostly in epigastric area but then also radiating to the mid abdomen. Pain was 7/10 in intensity. Pain was somewhat relieved with analgesia in ED. No reports of diarrhea or blood in stool or urine. No other complaints of chest pain, shortness of breath, palpitations. No lightheadedness or loss of consciousness. In ED, vitals are stable. He continued to have nausea and vomiting in ED. He had few rounds of antiemetics in ED. He was however given quite a few doses of narcotics in ED including dilaudid IV 1 mg 2 times and then total of 6 mg of IV morphine which may have exacerbated nausea and vomiting. He was started on IV fluids. His potassium was 3.2 on admission and was repleted in ED.  Hospital Course:  Intractable nausea and vomiting - unclear etiology, possible viral gastroenteritis. Patient has been having similar episodes for at least 10 years he tells me, and usually gets 1-2 self limiting episodes per year. He was seen and evaluated by GI in the past without a clear cut etiology. He was diagnosed at one point with ulcerative proctosigmoiditis  while in [place] and has had inflammatory changes on biopsy. He was on Mesalamine and this was discontinued last September. During this hospitalization, patient's abdominal pain, nausea and vomiting improved within hours, patient comfortable, able to eat a regular lunch without any discomfort, was discharged home in stable condition and to follow up with his PCP. Hypokalemia - secondary to GI losses, resolved with supplementation  Procedures:  None    Consultations:  None   Discharge Exam: Filed Vitals:   07/17/13 2104 07/17/13 2120 07/18/13 0639 07/18/13 1509  BP:  104/67 97/57 109/74  Pulse:  89 85 70  Temp:  98.3 F (36.8 C) 99.1 F (37.3 C) 98.6 F (37 C)  TempSrc:  Oral Oral Oral  Resp:  22 20 20   Height: 6' 2"  (1.88 m)     Weight: 95.2 kg (209 lb 14.1 oz)     SpO2:  100% 97% 99%   General: NAD Cardiovascular: RRR Respiratory: CTA biL  Discharge Instructions    Medication List         ADULT GUMMY Chew  Chew 2 tablets by mouth daily.     NORCO 5-325 MG per tablet  Generic drug:  HYDROcodone-acetaminophen  Take 1 tablet by mouth every 6 (six) hours as needed for moderate pain.     ondansetron 4 MG/2ML Soln injection  Commonly known as:  ZOFRAN  Inject 2 mLs (4 mg total) into the vein every 8 (eight) hours as needed for nausea or vomiting.     pantoprazole 40 MG tablet  Commonly known as:  PROTONIX  Take 1 tablet (40 mg total) by mouth daily.       Follow-up Information   Follow up with [name], MD. Schedule an appointment as soon as possible for a visit in 1 week.   Specialty:  Family Medicine   Contact information:   [place] [address] [phone]       The results of significant diagnostics from this hospitalization (including imaging, microbiology, ancillary and laboratory) are listed below for reference.    Labs: Basic Metabolic Panel:  Recent Labs Lab 07/17/13 1506 07/18/13 0327  NA 141 139  K 3.2*  4.1  CL 100 106  CO2 26 23  GLUCOSE 133* 109*  BUN 9 8  CREATININE 0.97 0.83  CALCIUM 9.6 8.8   Liver Function Tests:  Recent Labs Lab 07/17/13 1506 07/18/13 0327  AST 22 18  ALT 18 14  ALKPHOS 75 58  BILITOT 0.4 0.4  PROT 7.9 6.2  ALBUMIN 4.6 3.7    Recent Labs Lab 07/18/13 0327  LIPASE 34   CBC:  Recent Labs Lab 07/17/13 1506 07/18/13 0327  WBC 7.4 7.5  NEUTROABS 3.5  --   HGB 15.4 12.9*  HCT 44.1 37.4*  MCV 87.0 87.0  PLT 233 184    Signed:  Costin M Gherghe  [facility] 07/18/2013, 6:07 PM

## 2013-07-18 NOTE — Discharge Instructions (Signed)

## 2013-07-18 NOTE — Care Management Note (Signed)
CARE MANAGEMENT NOTE 07/18/2013  Patient:  FRANCISZEK, PLATTEN   Account Number:  [id number]  Date Initiated:  07/18/2013  Documentation initiated by:  Anhad Sheeley  Subjective/Objective Assessment:   38 yo male admitted with intractable N/V. PCP: [name], MD     Action/Plan:   Home when stable   Anticipated DC Date:     Anticipated DC Plan:  HOME/SELF CARE  In-house referral  NA      DC Planning Services  CM consult      Choice offered to / List presented to:  NA   DME arranged  NA      DME agency  NA     [place] arranged  NA      [place] agency  NA   Status of service:  Completed, signed off Medicare Important Message given?   (If response is "NO", the following Medicare IM given date fields will be blank) Date Medicare IM given:   Date Additional Medicare IM given:    Discharge Disposition:    Per UR Regulation:  Reviewed for med. necessity/level of care/duration of stay  If discussed at [place] of Stay Meetings, dates discussed:    Comments:  07/18/13 [place] Chart reviewed for utilization of services. No needs assessed at this time. Identified PCP and pharmacy.

## 2013-07-20 NOTE — ED Provider Notes (Signed)
Medical screening examination/treatment/procedure(s) were performed by non-physician practitioner and as supervising physician I was immediately available for consultation/collaboration.   EKG Interpretation None        Tanna Furry, MD 07/20/13 [phone]

## 2016-09-23 ENCOUNTER — Telehealth: Payer: Self-pay | Admitting: Family Medicine

## 2016-09-23 NOTE — Telephone Encounter (Signed)
Pt was last seen in oct 2014. Pt would like to re-est w/burchette. Can I sch?

## 2016-09-23 NOTE — Telephone Encounter (Signed)
Dr. Elease Hashimoto - Please advise. Thanks!

## 2016-09-23 NOTE — Telephone Encounter (Signed)
OK 

## 2016-09-24 ENCOUNTER — Ambulatory Visit: Payer: Self-pay | Admitting: Family Medicine

## 2016-09-24 NOTE — Telephone Encounter (Signed)
lmom for pt to call back

## 2016-09-24 NOTE — Telephone Encounter (Signed)
Pt has been sch

## 2016-09-24 NOTE — Telephone Encounter (Signed)
Please schedule

## 2016-10-06 ENCOUNTER — Ambulatory Visit (INDEPENDENT_AMBULATORY_CARE_PROVIDER_SITE_OTHER): Payer: BLUE CROSS/BLUE SHIELD | Admitting: Family Medicine

## 2016-10-06 ENCOUNTER — Encounter: Payer: Self-pay | Admitting: Family Medicine

## 2016-10-06 VITALS — BP 102/70 | HR 104 | Temp 98.8°F | Ht 74.5 in | Wt 187.8 lb

## 2016-10-06 DIAGNOSIS — Z113 Encounter for screening for infections with a predominantly sexual mode of transmission: Secondary | ICD-10-CM | POA: Diagnosis not present

## 2016-10-06 DIAGNOSIS — R350 Frequency of micturition: Secondary | ICD-10-CM | POA: Diagnosis not present

## 2016-10-06 MED ORDER — DOXYCYCLINE HYCLATE 100 MG PO CAPS: 100.0000 mg | ORAL_CAPSULE | Freq: Two times a day (BID) | ORAL | 0 refills | Status: DC

## 2016-10-06 NOTE — Progress Notes (Signed)
Subjective:     Patient ID: Ricky Warren, male   DOB: August 22, 1975, 41 y.o.   MRN: [id number]  HPI Patient seen to reestablish care. He had been diagnosed background 2005 with ulcerative colitis. He is currently not taking any medications. He states his had some "esophageal ulcers "in the past years ago and for years was treated for GERD. Currently stable without any medications.  Major issue today is 1 month history of some urine frequency. No burning with urination. No fevers or chills. He states he had similar symptoms with acute prostatitis back in 2012. He also is concerned because of some unprotected intercourse recently. He is single. Nonsmoker. No regular alcohol use. Works as a Arboriculturist.  Denies any history of STD. No recent skin rash. No penile discharge.  Past Medical History:  Diagnosis Date  . Allergy   . Chicken pox   . GERD (gastroesophageal reflux disease)   . Intractable nausea and vomiting - episodic 08/07/2012  . Ulcerative colitis ([place])   . Ulcerative proctosigmoiditis ([place])    Past Surgical History:  Procedure Laterality Date  . COLONOSCOPY W/ BIOPSIES     multiple  . ESOPHAGOGASTRODUODENOSCOPY     multiple    reports that he quit smoking about 14 years ago. His smoking use included Cigarettes. He has a 9.00 pack-year smoking history. He has never used smokeless tobacco. He reports that he does not drink alcohol or use drugs. family history includes Colon cancer in his maternal grandfather and unknown relative; Heart attack in his paternal uncle; Heart disease in his paternal grandfather; Stomach cancer in his maternal grandmother. Allergies  Allergen Reactions  . Ciprofloxacin     unknown  . Doxycycline Nausea Only    GI upset, nausea     Review of Systems  Constitutional: Negative for chills and fever.  Gastrointestinal: Negative for abdominal pain.  Genitourinary: Negative for discharge, dysuria, genital sores and penile pain.  Skin:  Negative for rash.       Objective:   Physical Exam  Constitutional: He appears well-developed and well-nourished.  Cardiovascular: Normal rate and regular rhythm.   Pulmonary/Chest: Effort normal and breath sounds normal. No respiratory distress. He has no wheezes. He has no rales.  Genitourinary: Rectum normal and prostate normal.  Genitourinary Comments: Prostate is not enlarged and nontender to palpation       Assessment:     Patient presents with concern for possible STD. He states he had similar symptoms with prostatitis by 2012. Exam today does not reveal any evidence for acute prostatitis.  Rule out chlamydia.    Plan:     -STD screening with HIV, RPR, chlamydia, GC, hepatitis B surface antigen -Follow-up probably for any fever, rash, or other new symptom -discussed safe sex practices.  Ricky Post MD [facility]

## 2016-10-07 LAB — HIV ANTIBODY (ROUTINE TESTING W REFLEX): HIV 1&2 Ab, 4th Generation: NONREACTIVE

## 2016-10-07 LAB — RPR

## 2016-10-07 LAB — HEPATITIS B SURFACE ANTIGEN: HEP B S AG: NEGATIVE

## 2016-10-07 LAB — GC/CHLAMYDIA PROBE AMP
CT Probe RNA: NOT DETECTED
GC PROBE AMP APTIMA: NOT DETECTED

## 2016-10-27 ENCOUNTER — Ambulatory Visit (INDEPENDENT_AMBULATORY_CARE_PROVIDER_SITE_OTHER): Payer: BLUE CROSS/BLUE SHIELD | Admitting: Family Medicine

## 2016-10-27 ENCOUNTER — Encounter: Payer: Self-pay | Admitting: Family Medicine

## 2016-10-27 VITALS — BP 102/78 | HR 102 | Temp 98.5°F | Wt 189.9 lb

## 2016-10-27 DIAGNOSIS — R1032 Left lower quadrant pain: Secondary | ICD-10-CM | POA: Diagnosis not present

## 2016-10-27 DIAGNOSIS — R300 Dysuria: Secondary | ICD-10-CM

## 2016-10-27 LAB — POCT URINALYSIS DIPSTICK
BILIRUBIN UA: NEGATIVE
Blood, UA: NEGATIVE
GLUCOSE UA: NEGATIVE
Ketones, UA: NEGATIVE
LEUKOCYTES UA: NEGATIVE
NITRITE UA: NEGATIVE
Protein, UA: NEGATIVE
Spec Grav, UA: 1.015 (ref 1.010–1.025)
Urobilinogen, UA: 0.2 E.U./dL
pH, UA: 6 (ref 5.0–8.0)

## 2016-10-27 NOTE — Progress Notes (Signed)
Subjective:     Patient ID: Ricky Warren, male   DOB: 07-25-1975, 41 y.o.   MRN: [id number]  HPI Patient seen with somewhat poorly localized and nondescript pain left lower quadrant with some radiation occasionally to the left testicle and left groin region. Occasional urine frequency and mild burning with urination.  Duration of symptoms about one month.  He just reestablished care recently. We did STD screening which was all normal. He's not had any recent sexual encounter since last visit. He describes pain for past month- somewhat intermittent left lower quadrant usually very mild and at worst 5 out of 10 severity. No clear GERD. Denies any skin rash. No fevers or chills. No change in bowel habits. He has history of ulcerative proctitis sigmoid-itis but no recent change in his stools and has not noted any bloody stools. No recent antibiotics. Recent prostate exam was unremarkable  He has been doing some lifting with helping his parents move some things up at [facility] but denies any specific injury.  Past Medical History:  Diagnosis Date  . Allergy   . Chicken pox   . GERD (gastroesophageal reflux disease)   . Intractable nausea and vomiting - episodic 08/07/2012  . Ulcerative colitis ([place])   . 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 COLONOSCOPY W/ BIOPSIES     multiple  . ESOPHAGOGASTRODUODENOSCOPY     multiple    reports that he quit smoking about 15 years ago. His smoking use included Cigarettes. He has a 9.00 pack-year smoking history. He has never used smokeless tobacco. He reports that he does not drink alcohol or use drugs. family history includes Colon cancer in his maternal grandfather and unknown relative; Heart attack in his paternal uncle; Heart disease in his paternal grandfather; Stomach cancer in his maternal grandmother. Allergies  Allergen Reactions  . Ciprofloxacin     unknown  . Doxycycline  Nausea Only    GI upset, nausea     Review of Systems  Constitutional: Negative for chills and fever.  Respiratory: Negative for shortness of breath.   Cardiovascular: Negative for chest pain.  Gastrointestinal: Positive for abdominal pain. Negative for blood in stool, diarrhea, nausea and vomiting.  Genitourinary: Positive for dysuria. Negative for hematuria.  Skin: Negative for rash.  Hematological: Negative for adenopathy. Does not bruise/bleed easily.       Objective:   Physical Exam  Constitutional: He appears well-developed and well-nourished.  Cardiovascular: Normal rate and regular rhythm.   Pulmonary/Chest: Effort normal and breath sounds normal. No respiratory distress. He has no wheezes. He has no rales.  Abdominal: Soft. Bowel sounds are normal. He exhibits no distension and no mass. There is no tenderness. There is no rebound and no guarding.  Genitourinary:  Genitourinary Comments: Testes are normal with no mass. Nontender. No hernia. No significant inguinal adenopathy  Skin: No rash noted.       Assessment:     Patient resents with mild intermittent poorly localized left lower quadrant abdominal pain and mild dysuria. Urine dipstick is completely normal. He's had multiple CT scans in the past. Nonacute exam    Plan:     -Recommend try to avoid further CT scans because of radiation exposure - and , does not have any acute concerns such as fever, appetite or weight loss changes, etc. -Check CBC with differential -Follow-up immediately for any fever or worsening pain or any new symptoms such as bloody stools -Consider  follow-up with GI if symptoms not improving over the next couple weeks  [name] MD [facility]

## 2016-10-27 NOTE — Patient Instructions (Signed)
Follow up promptly for any fever, bloody stools, or any progressive abdominal pain.

## 2016-10-28 LAB — CBC WITH DIFFERENTIAL/PLATELET
BASOS PCT: 1 % (ref 0.0–3.0)
Basophils Absolute: 0.1 10*3/uL (ref 0.0–0.1)
EOS ABS: 0.5 10*3/uL (ref 0.0–0.7)
Eosinophils Relative: 9.7 % — ABNORMAL HIGH (ref 0.0–5.0)
HCT: 44.5 % (ref 39.0–52.0)
Hemoglobin: 14.9 g/dL (ref 13.0–17.0)
LYMPHS PCT: 45 % (ref 12.0–46.0)
Lymphs Abs: 2.3 10*3/uL (ref 0.7–4.0)
MCHC: 33.5 g/dL (ref 30.0–36.0)
MCV: 92.6 fl (ref 78.0–100.0)
Monocytes Absolute: 0.3 10*3/uL (ref 0.1–1.0)
Monocytes Relative: 5.8 % (ref 3.0–12.0)
NEUTROS ABS: 2 10*3/uL (ref 1.4–7.7)
NEUTROS PCT: 38.5 % — AB (ref 43.0–77.0)
PLATELETS: 197 10*3/uL (ref 150.0–400.0)
RBC: 4.81 Mil/uL (ref 4.22–5.81)
RDW: 13.2 % (ref 11.5–15.5)
WBC: 5.2 10*3/uL (ref 4.0–10.5)

## 2016-11-27 ENCOUNTER — Encounter: Payer: Self-pay | Admitting: Family Medicine

## 2017-05-04 ENCOUNTER — Ambulatory Visit: Payer: BLUE CROSS/BLUE SHIELD | Admitting: Family Medicine

## 2017-05-04 ENCOUNTER — Encounter: Payer: Self-pay | Admitting: Family Medicine

## 2017-05-04 VITALS — BP 102/70 | HR 89 | Temp 98.6°F | Wt 195.1 lb

## 2017-05-04 DIAGNOSIS — R3589 Other polyuria: Secondary | ICD-10-CM

## 2017-05-04 DIAGNOSIS — R358 Other polyuria: Secondary | ICD-10-CM | POA: Diagnosis not present

## 2017-05-04 DIAGNOSIS — N50819 Testicular pain, unspecified: Secondary | ICD-10-CM | POA: Diagnosis not present

## 2017-05-04 LAB — POCT URINALYSIS DIPSTICK
Bilirubin, UA: NEGATIVE
Blood, UA: NEGATIVE
Glucose, UA: NEGATIVE
Ketones, UA: NEGATIVE
Leukocytes, UA: NEGATIVE
NITRITE UA: NEGATIVE
Protein, UA: NEGATIVE
SPEC GRAV UA: 1.015 (ref 1.010–1.025)
Urobilinogen, UA: 0.2 E.U./dL
pH, UA: 7 (ref 5.0–8.0)

## 2017-05-04 NOTE — Progress Notes (Signed)
Subjective:     Patient ID: Ricky Warren, male   DOB: 1975/08/15, 42 y.o.   MRN: [id number]  HPI Patient seen with poorly localized bilateral testicular pain. He's had somewhat similar episodes in the past. Denies any recent burning with urination. He has have some occasional polyuria. No penile discharge. Question of remote history of epididymitis. Pain is relatively mild. Sometimes radiates toward the pelvic region. No change of bowel habits. No alleviating or exacerbating factors  History of ulcerative colitis. He was taken off Asacol by GI several years ago. Denies any bloody stools. No diarrhea. Was told he would need repeat colonoscopy by 2020.  Past Medical History:  Diagnosis Date  . Allergy   . Chicken pox   . GERD (gastroesophageal reflux disease)   . Intractable nausea and vomiting - episodic 08/07/2012  . Ulcerative colitis ([place])   . 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 COLONOSCOPY W/ BIOPSIES     multiple  . ESOPHAGOGASTRODUODENOSCOPY     multiple    reports that he quit smoking about 15 years ago. His smoking use included cigarettes. He has a 9.00 pack-year smoking history. he has never used smokeless tobacco. He reports that he does not drink alcohol or use drugs. family history includes Colon cancer in his maternal grandfather and unknown relative; Heart attack in his paternal uncle; Heart disease in his paternal grandfather; Stomach cancer in his maternal grandmother. Allergies  Allergen Reactions  . Ciprofloxacin     unknown  . Doxycycline Nausea Only    GI upset, nausea     Review of Systems  Constitutional: Negative for chills and fever.  Gastrointestinal: Negative for abdominal pain.  Genitourinary: Positive for frequency and testicular pain. Negative for flank pain and hematuria.       Objective:   Physical Exam  Constitutional: He appears well-developed and well-nourished.   Cardiovascular: Normal rate and regular rhythm.  Pulmonary/Chest: Effort normal and breath sounds normal. No respiratory distress. He has no wheezes. He has no rales.  Genitourinary:  Genitourinary Comments: Testes are normal bilaterally. No mass. No epididymis tenderness. No hernia.       Assessment:     Patient presents with somewhat of a chronic intermittent problem of  bilateral testicular pain.  No worrisome findings on exam    Plan:     -Urine dipstick normal -Given duration of symptoms will set up testicle ultrasound to further assess -Consider urology referral if symptoms persist  [name] MD [facility] at [facility]

## 2017-05-04 NOTE — Patient Instructions (Signed)
Set up GI follow up by next year.

## 2017-05-19 ENCOUNTER — Telehealth: Payer: Self-pay | Admitting: Family Medicine

## 2017-05-19 DIAGNOSIS — N50819 Testicular pain, unspecified: Secondary | ICD-10-CM

## 2017-05-19 NOTE — Telephone Encounter (Signed)
Okay to change?

## 2017-05-19 NOTE — Telephone Encounter (Signed)
OK 

## 2017-05-19 NOTE — Telephone Encounter (Signed)
Copied from [place] [id number]. Topic: Quick Communication - See Telephone Encounter >> May 19, 2017  1:29 PM Burnis Medin, NT wrote: CRM for notification. See Telephone encounter for: [facility] is calling because they received orders for pt. to have a scrotum ultra sound and it needs to be changed to [id number] scrotum ultra sound with doppler. Pls call back for order.   05/19/17.

## 2017-05-26 ENCOUNTER — Other Ambulatory Visit: Payer: BLUE CROSS/BLUE SHIELD

## 2017-06-02 ENCOUNTER — Ambulatory Visit
Admission: RE | Admit: 2017-06-02 | Discharge: 2017-06-02 | Disposition: A | Discharge status: Home / Self Care | Payer: BLUE CROSS/BLUE SHIELD | Source: Ambulatory Visit | Attending: Family Medicine | Admitting: Family Medicine

## 2017-06-02 DIAGNOSIS — N50819 Testicular pain, unspecified: Secondary | ICD-10-CM | POA: Diagnosis not present

## 2017-06-22 ENCOUNTER — Ambulatory Visit: Payer: BLUE CROSS/BLUE SHIELD | Admitting: Family Medicine

## 2017-06-22 ENCOUNTER — Encounter: Payer: Self-pay | Admitting: Family Medicine

## 2017-06-22 VITALS — BP 110/80 | HR 99 | Temp 98.7°F | Wt 187.6 lb

## 2017-06-22 DIAGNOSIS — N5082 Scrotal pain: Secondary | ICD-10-CM | POA: Diagnosis not present

## 2017-06-22 MED ORDER — GABAPENTIN 100 MG PO CAPS: 100.0000 mg | ORAL_CAPSULE | Freq: Two times a day (BID) | ORAL | 5 refills | Status: DC

## 2017-06-22 NOTE — Patient Instructions (Signed)
Start the Gabapentin one at night and may add second dose in the daytime if no side effects  Give me some feedback in 2 weeks if not improved.

## 2017-06-22 NOTE — Progress Notes (Signed)
Subjective:     Patient ID: Ricky Warren, male   DOB: 04-Jan-1976, 42 y.o.   MRN: [id number]  HPI Patient seen for follow-up regarding his chronic bilateral scrotal testicular pain. We sent her for ultrasound which showed no significant abnormalities. He had no masses. Incidental 5 mm left scrotal pearl.  He was having some urine frequency and no symptoms have resolved. Recent urinalysis unremarkable. No dysuria.  His pain is somewhat transient and achy to sharp quality and sometimes very intense and 8 out of 10 severity. No clear triggering factors. Symptoms tend to occur more frequently at rest. He has not seen any visible swelling. No recent hernia on exam.  Past Medical History:  Diagnosis Date  . Allergy   . Chicken pox   . GERD (gastroesophageal reflux disease)   . Intractable nausea and vomiting - episodic 08/07/2012  . Ulcerative colitis ([place])   . 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 COLONOSCOPY W/ BIOPSIES     multiple  . ESOPHAGOGASTRODUODENOSCOPY     multiple    reports that he quit smoking about 15 years ago. His smoking use included cigarettes. He has a 9.00 pack-year smoking history. He has never used smokeless tobacco. He reports that he does not drink alcohol or use drugs. family history includes Colon cancer in his maternal grandfather and unknown relative; Heart attack in his paternal uncle; Heart disease in his paternal grandfather; Stomach cancer in his maternal grandmother. Allergies  Allergen Reactions  . Ciprofloxacin     unknown  . Doxycycline Nausea Only    GI upset, nausea     Review of Systems  Constitutional: Negative for chills and fever.  Genitourinary: Positive for testicular pain. Negative for decreased urine volume, difficulty urinating, discharge, dysuria, flank pain, genital sores, hematuria, penile pain and scrotal swelling.  Hematological: Negative for adenopathy.        Objective:   Physical Exam  Constitutional: He appears well-developed and well-nourished.  Cardiovascular: Normal rate and regular rhythm.  Pulmonary/Chest: Effort normal and breath sounds normal. No respiratory distress. He has no wheezes. He has no rales.       Assessment:     Chronic transient bilateral scrotal pain with recent normal ultrasound. No evidence for hernia. Question neuropathic    Plan:     -Patient cannot take nonsteroidals because of his history of colitis -We recommended consider trial of low-dose gabapentin 100 mg daily at bedtime and may increase to twice a day after few days if no side effects -Give [place] some feedback in 2-3 weeks if not seeing improvement  Ricky Post MD [facility] at [facility]

## 2017-07-27 ENCOUNTER — Ambulatory Visit: Payer: BLUE CROSS/BLUE SHIELD | Admitting: Family Medicine

## 2017-07-27 ENCOUNTER — Encounter: Payer: Self-pay | Admitting: Family Medicine

## 2017-07-27 VITALS — BP 110/80 | HR 80 | Temp 98.4°F | Wt 187.3 lb

## 2017-07-27 DIAGNOSIS — F419 Anxiety disorder, unspecified: Secondary | ICD-10-CM | POA: Diagnosis not present

## 2017-07-27 DIAGNOSIS — M545 Low back pain, unspecified: Secondary | ICD-10-CM

## 2017-07-27 DIAGNOSIS — G4700 Insomnia, unspecified: Secondary | ICD-10-CM | POA: Diagnosis not present

## 2017-07-27 DIAGNOSIS — R5383 Other fatigue: Secondary | ICD-10-CM | POA: Diagnosis not present

## 2017-07-27 MED ORDER — CYCLOBENZAPRINE HCL 5 MG PO TABS: ORAL_TABLET | ORAL | 2 refills | Status: DC

## 2017-07-27 NOTE — Patient Instructions (Signed)
Lumbosacral Strain Lumbosacral strain is an injury that causes pain in the lower back (lumbosacral spine). This injury usually occurs from overstretching the muscles or ligaments along your spine. A strain can affect one or more muscles or cord-like tissues that connect bones to other bones (ligaments). What are the causes? This condition may be caused by:  A hard, direct hit (blow) to the back.  Excessive stretching of the lower back muscles. This may result from: ? A fall. ? Lifting something heavy. ? Repetitive movements such as bending or crouching.  What increases the risk? The following factors may increase your risk of getting this condition:  Participating in sports or activities that involve: ? A sudden twist of the back. ? Pushing or pulling motions.  Being overweight or obese.  Having poor strength and flexibility, especially tight hamstrings or weak muscles in the back or abdomen.  Having too much of a curve in the lower back.  Having a pelvis that is tilted forward.  What are the signs or symptoms? The main symptom of this condition is pain in the lower back, at the site of the strain. Pain may extend (radiate) down one or both legs. How is this diagnosed? This condition is diagnosed based on:  Your symptoms.  Your medical history.  A physical exam. ? Your health care provider may push on certain areas of your back to determine the source of your pain. ? You may be asked to bend forward, backward, and side to side to assess the severity of your pain and your range of motion.  Imaging tests, such as: ? X-rays. ? MRI.  How is this treated? Treatment for this condition may include:  Putting heat and cold on the affected area.  Medicines to help relieve pain and relax your muscles (muscle relaxants).  NSAIDs to help reduce swelling and discomfort.  When your symptoms improve, it is important to gradually return to your normal routine as soon as possible  to reduce pain, avoid stiffness, and avoid loss of muscle strength. Generally, symptoms should improve within 6 weeks of treatment. However, recovery time varies. Follow these instructions at home: Managing pain, stiffness, and swelling   If directed, put ice on the injured area during the first 24 hours after your strain. ? Put ice in a plastic bag. ? Place a towel between your skin and the bag. ? Leave the ice on for 20 minutes, 2-3 times a day.  If directed, put heat on the affected area as often as told by your health care provider. Use the heat source that your health care provider recommends, such as a moist heat pack or a heating pad. ? Place a towel between your skin and the heat source. ? Leave the heat on for 20-30 minutes. ? Remove the heat if your skin turns bright red. This is especially important if you are unable to feel pain, heat, or cold. You may have a greater risk of getting burned. Activity  Rest and return to your normal activities as told by your health care provider. Ask your health care provider what activities are safe for you.  Avoid activities that take a lot of energy for as long as told by your health care provider. General instructions  Take over-the-counter and prescription medicines only as told by your health care provider.  Donot drive or use heavy machinery while taking prescription pain medicine.  Do not use any products that contain nicotine or tobacco, such as cigarettes and e-cigarettes.  If you need help quitting, ask your health care provider.  Keep all follow-up visits as told by your health care provider. This is important. How is this prevented?  Use correct form when playing sports and lifting heavy objects.  Use good posture when sitting and standing.  Maintain a healthy weight.  Sleep on a mattress with medium firmness to support your back.  Be safe and responsible while being active to avoid falls.  Do at least 150 minutes of  moderate-intensity exercise each week, such as brisk walking or water aerobics. Try a form of exercise that takes stress off your back, such as swimming or stationary cycling.  Maintain physical fitness, including: ? Strength. ? Flexibility. ? Cardiovascular fitness. ? Endurance. Contact a health care provider if:  Your back pain does not improve after 6 weeks of treatment.  Your symptoms get worse. Get help right away if:  Your back pain is severe.  You cannot stand or walk.  You have difficulty controlling when you urinate or when you have a bowel movement.  You feel nauseous or you vomit.  Your feet get very cold.  You have numbness, tingling, weakness, or problems using your arms or legs.  You develop any of the following: ? Shortness of breath. ? Dizziness. ? Pain in your legs. ? Weakness in your buttocks or legs. ? Discoloration of the skin on your toes or legs. This information is not intended to replace advice given to you by your health care provider. Make sure you discuss any questions you have with your health care provider. Document Released: 12/04/2004 Document Revised: 09/14/2015 Document Reviewed: 07/29/2015 Elsevier Interactive Patient Education  [employer].

## 2017-07-27 NOTE — Progress Notes (Signed)
Subjective:     Patient ID: Ricky Warren, male   DOB: 1975-07-04, 42 y.o.   MRN: [id number]  HPI Patient seen for the following complaints and issues  He relates some low back pain in the midline for at least a few weeks now. Denies specific injury. He states he wakes up in the morning with frequent stiffness in his lower back region. Has not been doing any stretches recently. Still plays basketball some for exercise and occasional weights. No difficulties with running or basketball. No radiculitis symptoms. No numbness or weakness. No heat or ice treatment.  Complains of fatigue. He states he really is only getting about 4-5 hours sleep at night. Sometimes has difficulty falling asleep and sometimes early wakening. Denies depression. He has some chronic anxiety. No regular caffeine use. Rare alcohol use. No regular daytime naps  Chronic anxiety. He thinks this is social anxiety. He states his hands get sweaty frequently in public. He has some dread of going in public places. Previously took Lexapro but states he had significant weight gain. He's been reluctant to go on additional medications but may be willing to consider at some point.  Chronic scrotal pain. Ultrasound unremarkable. We tried low-dose gabapentin but this did not seem to help much and he stopped  Past Medical History:  Diagnosis Date  . Allergy   . Chicken pox   . GERD (gastroesophageal reflux disease)   . Intractable nausea and vomiting - episodic 08/07/2012  . Ulcerative colitis ([place])   . 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 COLONOSCOPY W/ BIOPSIES     multiple  . ESOPHAGOGASTRODUODENOSCOPY     multiple    reports that he quit smoking about 15 years ago. His smoking use included cigarettes. He has a 9.00 pack-year smoking history. He has never used smokeless tobacco. He reports that he does not drink alcohol or use drugs. family history includes Colon  cancer in his maternal grandfather and unknown relative; Heart attack in his paternal uncle; Heart disease in his paternal grandfather; Stomach cancer in his maternal grandmother. Allergies  Allergen Reactions  . Ciprofloxacin     unknown  . Doxycycline Nausea Only    GI upset, nausea     Review of Systems  Constitutional: Positive for fatigue. Negative for appetite change, chills, fever and unexpected weight change.  Cardiovascular: Negative for chest pain.  Gastrointestinal: Negative for abdominal pain.  Genitourinary: Negative for dysuria.  Musculoskeletal: Positive for back pain.  Hematological: Negative for adenopathy.  Psychiatric/Behavioral: Positive for sleep disturbance. Negative for agitation and dysphoric mood. The patient is nervous/anxious.        Objective:   Physical Exam  Constitutional: He appears well-developed and well-nourished.  Cardiovascular: Normal rate and regular rhythm.  Pulmonary/Chest: Effort normal and breath sounds normal. He has no wheezes. He has no rales.  Musculoskeletal: He exhibits no edema.  Straight leg raises are negative bilaterally No specific areas of point tenderness in the back  Neurological:  Full-strength lower extremities. He has 2+ reflexes ankle and knee bilaterally.       Assessment:     #1 bilateral lumbar back pain without radiculitis features and nonfocal neuro exam. Suspect musculoskeletal  #2 fatigue probably largely related to poor sleep quality in an adequate sleep  #3 chronic anxiety. Suspect social anxiety    Plan:     -We recommended conservative measures such as heat and ice and also recommended some specific stretches for  his low back -Continue with aerobic activity as tolerated -Short-term trial of Flexeril 5 mg 1-2 daily at bedtime. Reviewed potential side effects -Consider physical therapy if not improving with the above -Hopefully Flexeril will help get his sleep back on track -Consider trial of Prozac  for his social anxiety. At this point he wishes to wait but will notify us if he changes his mind  [name] MD [facility] at [facility]

## 2017-08-18 ENCOUNTER — Ambulatory Visit: Payer: BLUE CROSS/BLUE SHIELD | Admitting: Family Medicine

## 2017-08-18 ENCOUNTER — Encounter: Payer: Self-pay | Admitting: Family Medicine

## 2017-08-18 VITALS — BP 108/68 | HR 101 | Temp 98.6°F | Wt 189.1 lb

## 2017-08-18 DIAGNOSIS — F419 Anxiety disorder, unspecified: Secondary | ICD-10-CM | POA: Diagnosis not present

## 2017-08-18 MED ORDER — FLUOXETINE HCL 20 MG PO TABS: 20.0000 mg | ORAL_TABLET | Freq: Every day | ORAL | 3 refills | Status: DC

## 2017-08-18 NOTE — Progress Notes (Signed)
  Subjective:     Patient ID: Ricky Warren, male   DOB: [birth year], 42 y.o.   MRN: [id number]  HPI Patient seen for follow-up regarding some anxiety symptoms. Had this chronically for years. As per previous note treated once before with Lexapro but had some weight gain. He has some definite components of social anxiety but may have some generalized anxiety symptoms as well. 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  He also does some music recording. He sometimes sits for several hours in the same position. Had some upper back stiffness fairly diffusely. Using low-dose Flexeril at night which does help sleep. No weakness.  Past Medical History:  Diagnosis Date  . Allergy   . Chicken pox   . GERD (gastroesophageal reflux disease)   . Intractable nausea and vomiting - episodic 08/07/2012  . Ulcerative colitis ([place])   . 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 COLONOSCOPY W/ BIOPSIES     multiple  . ESOPHAGOGASTRODUODENOSCOPY     multiple    reports that he quit smoking about 15 years ago. His smoking use included cigarettes. He has a 9.00 pack-year smoking history. He has never used smokeless tobacco. He reports that he does not drink alcohol or use drugs. family history includes Colon cancer in his maternal grandfather and unknown relative; Heart attack in his paternal uncle; Heart disease in his paternal grandfather; Stomach cancer in his maternal grandmother. Allergies  Allergen Reactions  . Ciprofloxacin     unknown  . Doxycycline Nausea Only    GI upset, nausea     Review of Systems  Respiratory: Negative for shortness of breath.   Cardiovascular: Negative for chest pain.  Musculoskeletal: Positive for back pain.  Psychiatric/Behavioral: Negative for dysphoric mood. The patient is nervous/anxious.        Objective:   Physical Exam  Constitutional:  He appears well-developed and well-nourished.  Neck: Neck supple.  Cardiovascular: Normal rate and regular rhythm.       Assessment:     #1 chronic anxiety. Suspect some social anxiety  #2 diffuse upper back pain. Suspect related largely to muscle tension    Plan:     -We discussed nonpharmacologic ways of trying to deal with muscle tension and upper back pain including exercise, adequate sleep, muscle relaxers. Continue Flexeril as needed  -Recommend trial of fluoxetine 20 mg daily and reassess in 3-4 weeks  [name] MD [facility] at [facility]

## 2017-12-20 ENCOUNTER — Other Ambulatory Visit: Payer: Self-pay | Admitting: Family Medicine

## 2017-12-21 DIAGNOSIS — R5383 Other fatigue: Secondary | ICD-10-CM | POA: Diagnosis not present

## 2017-12-21 DIAGNOSIS — R509 Fever, unspecified: Secondary | ICD-10-CM | POA: Diagnosis not present

## 2017-12-21 LAB — CBC AND DIFFERENTIAL: WBC: 9.5

## 2017-12-30 ENCOUNTER — Encounter: Payer: Self-pay | Admitting: Family Medicine

## 2017-12-30 ENCOUNTER — Other Ambulatory Visit: Payer: Self-pay

## 2017-12-30 ENCOUNTER — Ambulatory Visit: Payer: BLUE CROSS/BLUE SHIELD | Admitting: Family Medicine

## 2017-12-30 VITALS — BP 98/60 | HR 76 | Temp 98.2°F | Wt 195.7 lb

## 2017-12-30 DIAGNOSIS — R509 Fever, unspecified: Secondary | ICD-10-CM

## 2017-12-30 NOTE — Patient Instructions (Signed)
Continue to monitor fever  Get back in here same day (if possible) if having recurrent fever.

## 2017-12-30 NOTE — Progress Notes (Addendum)
Subjective:     Patient ID: Ricky Warren, male   DOB: 03-Feb-1976, 42 y.o.   MRN: [id number]  HPI Patient is seen with intermittent fever since early August.  He states he has checked this with 2 different thermometers.  He has had what he describes as fever up to 102 about every 2 weeks usually for 3 to 4 days.  He has associated chills.  He has had some diffuse body aches with episodes of fever.  He states this started with cold-like symptom but those symptoms have fully resolved.  He denies any recent antibiotics.  No travels.  No tick bites.  His weight is actually up from last June.  No rash.  No abdominal pain.  Rare headaches.  Denies any sore throat or cough.  No lymphadenopathy.  No diarrhea.  He states that with episodes of fever he tends to be achy in his knees and hands but does not see any visible swelling.  No dysuria.  Went to urgent care couple weeks ago and had influenza screen that was negative, CBC which was normal, and normal urinalysis.  Past Medical History:  Diagnosis Date  . Allergy   . Chicken pox   . GERD (gastroesophageal reflux disease)   . Intractable nausea and vomiting - episodic 08/07/2012  . Ulcerative colitis ([place])   . 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 COLONOSCOPY W/ BIOPSIES     multiple  . ESOPHAGOGASTRODUODENOSCOPY     multiple    reports that he quit smoking about 16 years ago. His smoking use included cigarettes. He has a 9.00 pack-year smoking history. He has never used smokeless tobacco. He reports that he does not drink alcohol or use drugs. family history includes Colon cancer in his maternal grandfather and unknown relative; Heart attack in his paternal uncle; Heart disease in his paternal grandfather; Stomach cancer in his maternal grandmother. Allergies  Allergen Reactions  . Ciprofloxacin     unknown  . Doxycycline Nausea Only    GI upset, nausea     Review of  Systems  Constitutional: Positive for chills and fever. Negative for appetite change and unexpected weight change.  HENT: Negative for sinus pressure, sinus pain and sore throat.   Respiratory: Negative for cough and shortness of breath.   Cardiovascular: Negative for chest pain.  Gastrointestinal: Negative for abdominal pain, diarrhea, nausea and vomiting.  Genitourinary: Negative for discharge and dysuria.  Skin: Negative for rash.  Neurological: Negative for headaches.  Hematological: Negative for adenopathy.       Objective:   Physical Exam  Constitutional: He appears well-developed and well-nourished.  HENT:  Right Ear: External ear normal.  Left Ear: External ear normal.  Mouth/Throat: Oropharynx is clear and moist. No oropharyngeal exudate.  Neck: Neck supple.  Cardiovascular: Normal rate and regular rhythm.  Pulmonary/Chest: Effort normal and breath sounds normal. He has no rales.  Abdominal: Soft. Bowel sounds are normal. He exhibits no mass. There is no tenderness. There is no rebound and no guarding.  No organomegaly.  Musculoskeletal: He exhibits no edema.  Lymphadenopathy:    He has no cervical adenopathy.  Skin: No rash noted.       Assessment:     Patient presents with history of over 87-monthperiod of intermittent fevers as high as 102 by home readings.  No documented fever today.  Otherwise he has paucity of symptoms or signs.  He did report basic cold-like symptoms  at the beginning of this.  He is nontoxic in appearance and had recent lab work which was unremarkable as above    Plan:     -Recommend continued close monitoring and keep a diary of his fever and body temperatures.  We have also asked that if he has recurrent fever to get in here same day if possible for further evaluation -Check further labs with repeat CBC, HIV antibody, Epstein-Barr virus, CCP antibodies, C-reactive protein, comprehensive metabolic panel, Lyme antibodies, rheumatoid factor, ANA.   Consider blood cultures if he presents same day with fever -Follow-up immediately for any new symptoms or other concerns -consider CXR at follow up if symptoms persist.  [name] MD [facility] at [facility]

## 2017-12-31 LAB — COMPREHENSIVE METABOLIC PANEL
ALBUMIN: 4.2 g/dL (ref 3.5–5.2)
ALT: 22 U/L (ref 0–53)
AST: 25 U/L (ref 0–37)
Alkaline Phosphatase: 75 U/L (ref 39–117)
BUN: 10 mg/dL (ref 6–23)
CALCIUM: 9.4 mg/dL (ref 8.4–10.5)
CHLORIDE: 100 meq/L (ref 96–112)
CO2: 30 mEq/L (ref 19–32)
CREATININE: 0.88 mg/dL (ref 0.40–1.50)
GFR: 101.82 mL/min (ref >60.00)
Glucose, Bld: 87 mg/dL (ref 70–99)
POTASSIUM: 4.6 meq/L (ref 3.5–5.1)
Sodium: 138 mEq/L (ref 135–145)
Total Bilirubin: 0.4 mg/dL (ref 0.2–1.2)
Total Protein: 7.3 g/dL (ref 6.0–8.3)

## 2017-12-31 LAB — CBC WITH DIFFERENTIAL/PLATELET
BASOS PCT: 0.8 % (ref 0.0–3.0)
Basophils Absolute: 0.1 10*3/uL (ref 0.0–0.1)
EOS PCT: 7.4 % — AB (ref 0.0–5.0)
Eosinophils Absolute: 0.6 10*3/uL (ref 0.0–0.7)
HEMATOCRIT: 41.4 % (ref 39.0–52.0)
HEMOGLOBIN: 14.1 g/dL (ref 13.0–17.0)
LYMPHS PCT: 33.8 % (ref 12.0–46.0)
Lymphs Abs: 2.5 10*3/uL (ref 0.7–4.0)
MCHC: 34 g/dL (ref 30.0–36.0)
MCV: 88.7 fl (ref 78.0–100.0)
MONOS PCT: 5.9 % (ref 3.0–12.0)
Monocytes Absolute: 0.4 10*3/uL (ref 0.1–1.0)
Neutro Abs: 3.9 10*3/uL (ref 1.4–7.7)
Neutrophils Relative %: 52.1 % (ref 43.0–77.0)
Platelets: 285 10*3/uL (ref 150.0–400.0)
RBC: 4.67 Mil/uL (ref 4.22–5.81)
RDW: 12.6 % (ref 11.5–15.5)
WBC: 7.5 10*3/uL (ref 4.0–10.5)

## 2017-12-31 LAB — EPSTEIN-BARR VIRUS VCA ANTIBODY PANEL: EBV NA IgG: <18 U/mL

## 2017-12-31 LAB — C-REACTIVE PROTEIN: CRP: 0.4 mg/dL — AB (ref 0.5–20.0)

## 2018-01-01 ENCOUNTER — Encounter: Payer: Self-pay | Admitting: Family Medicine

## 2018-01-01 LAB — HIV ANTIBODY (ROUTINE TESTING W REFLEX): HIV 1&2 Ab, 4th Generation: NONREACTIVE

## 2018-01-01 LAB — RHEUMATOID FACTOR: Rheumatoid fact SerPl-aCnc: <14 [IU]/mL

## 2018-01-01 LAB — ANA: ANA: NEGATIVE

## 2018-01-01 LAB — B. BURGDORFI ANTIBODIES: B burgdorferi Ab IgG+IgM: <0.9 {index}

## 2018-01-01 LAB — CYCLIC CITRUL PEPTIDE ANTIBODY, IGG: Cyclic Citrullin Peptide Ab: <16 UNITS

## 2018-01-03 ENCOUNTER — Encounter: Payer: Self-pay | Admitting: Family Medicine

## 2019-07-05 ENCOUNTER — Other Ambulatory Visit: Payer: Self-pay

## 2019-07-06 ENCOUNTER — Ambulatory Visit (INDEPENDENT_AMBULATORY_CARE_PROVIDER_SITE_OTHER): Payer: BC Managed Care – PPO | Admitting: Family Medicine

## 2019-07-06 ENCOUNTER — Encounter: Payer: Self-pay | Admitting: Family Medicine

## 2019-07-06 VITALS — BP 118/64 | HR 100 | Temp 97.9°F | Wt 188.7 lb

## 2019-07-06 DIAGNOSIS — K5130 Ulcerative (chronic) rectosigmoiditis without complications: Secondary | ICD-10-CM

## 2019-07-06 DIAGNOSIS — F419 Anxiety disorder, unspecified: Secondary | ICD-10-CM

## 2019-07-06 DIAGNOSIS — Z113 Encounter for screening for infections with a predominantly sexual mode of transmission: Secondary | ICD-10-CM

## 2019-07-06 DIAGNOSIS — M545 Low back pain, unspecified: Secondary | ICD-10-CM

## 2019-07-06 DIAGNOSIS — F4010 Social phobia, unspecified: Secondary | ICD-10-CM

## 2019-07-06 DIAGNOSIS — R3989 Other symptoms and signs involving the genitourinary system: Secondary | ICD-10-CM

## 2019-07-06 LAB — POCT URINALYSIS DIPSTICK
Bilirubin, UA: NEGATIVE
Blood, UA: NEGATIVE
Glucose, UA: NEGATIVE
Ketones, UA: NEGATIVE
Leukocytes, UA: NEGATIVE
Nitrite, UA: NEGATIVE
Protein, UA: POSITIVE — AB
Spec Grav, UA: 1.025 (ref 1.010–1.025)
Urobilinogen, UA: 0.2 E.U./dL
pH, UA: 5.5 (ref 5.0–8.0)

## 2019-07-06 MED ORDER — SERTRALINE HCL 50 MG PO TABS: 50.0000 mg | ORAL_TABLET | Freq: Every day | ORAL | 3 refills | Status: DC

## 2019-07-06 NOTE — Progress Notes (Signed)
Subjective:     Patient ID: Ricky Warren, male   DOB: 1975/11/26, 44 y.o.   MRN: [id number]  HPI  Ricky Warren is seen today for several issues as follows  Longstanding history of intermittent low back pain.  Current episode started several days ago.  Denies any specific injury.  Symptoms somewhat bilateral.  Worse with movement.  No radiculitis symptoms.  Denies lower extremity numbness or weakness.  Sometimes plays guitar for several hours straight in same position and he thinks this may be an issue.  He has a relatively new mattress which he thinks is very good quality mattress.  He has been using some Biofreeze and ice which seems to help  Second issue is he has had years of battling increased stress and anxiety symptoms.  He does have specific stress in social situations.  He frequently has sweaty palms and feet and feels anxious.  He has history of increased thirst when he feels anxious.  No appetite or weight changes.  No polyuria.  He has been previously on Lexapro but states he had weight gain.  He took Prozac but did not see much improvement.    Patient has history of ulcerative proctosigmoiditis.  He states he was told to get follow-up colonoscopy around age 35.  Denies any recent stool changes.  Other issue is that he is requesting STD screening.  He does try to use barrier protection regularly.  Has had different partners in the past.  He is asymptomatic this time.  He denies any penile discharge or any rashes.  Sometimes has weak urine stream but no burning with urination.  Past Medical History:  Diagnosis Date  . Allergy   . Chicken pox   . GERD (gastroesophageal reflux disease)   . Intractable nausea and vomiting - episodic 08/07/2012  . Ulcerative colitis ([place])   . 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 COLONOSCOPY W/ BIOPSIES     multiple  . ESOPHAGOGASTRODUODENOSCOPY     multiple    reports that he quit  smoking about 17 years ago. His smoking use included cigarettes. He has a 9.00 pack-year smoking history. He has never used smokeless tobacco. He reports that he does not drink alcohol or use drugs. family history includes Colon cancer in his maternal grandfather and unknown relative; Heart attack in his paternal uncle; Heart disease in his paternal grandfather; Stomach cancer in his maternal grandmother. Allergies  Allergen Reactions  . Ciprofloxacin     unknown  . Doxycycline Nausea Only    GI upset, nausea     Wt Readings from Last 3 Encounters:  07/06/19 188 lb 11.2 oz (85.6 kg)  12/30/17 195 lb 11.2 oz (88.8 kg)  08/18/17 189 lb 1.6 oz (85.8 kg)      Review of Systems  Constitutional: Negative for appetite change, chills, fever and unexpected weight change.  Respiratory: Negative for cough and shortness of breath.   Cardiovascular: Negative for chest pain, palpitations and leg swelling.  Gastrointestinal: Negative for abdominal pain.  Genitourinary: Negative for hematuria.  Musculoskeletal: Positive for back pain.  Neurological: Negative for weakness and numbness.  Psychiatric/Behavioral: Negative for dysphoric mood. The patient is nervous/anxious.        Objective:   Physical Exam Vitals reviewed.  Constitutional:      Appearance: Normal appearance.  Cardiovascular:     Rate and Rhythm: Normal rate and regular rhythm.  Pulmonary:     Effort: Pulmonary effort is  normal.     Breath sounds: Normal breath sounds.  Musculoskeletal:     Cervical back: Neck supple.  Skin:    Findings: No rash.  Neurological:     General: No focal deficit present.     Mental Status: He is alert.     Comments: No focal weakness.  Psychiatric:        Mood and Affect: Mood normal.        Thought Content: Thought content normal.        Assessment:     #1 intermittent bilateral lower lumbar back pain.  Suspect musculoskeletal.  #2 history of ulcerative proctosigmoiditis.  Patient  requesting referral back to GI  #3 longstanding history of anxiety.  He has overlapping anxiety syndrome but does have some social anxiety symptoms which are currently poorly controlled.  No avoidance behaviors.  #4 request for STD screening.  Currently asymptomatic.    Plan:     -We will check urine for chlamydia and GC, blood for HIV, RPR, hepatitis B surface antigen  -Discussed importance of barrier protection at all times  -Set up referral back to GI  -Continue conservative measures for back including stretches, ice, topical sports creams.  Suggested increase core strengthening exercises  -Discussed trial of sertraline 50 mg 1/2 tablet daily for 1 week and then titrate to 50 mg daily  -Reassess in 1 month  [name] MD [facility] at [facility]

## 2019-07-07 LAB — HEPATITIS B SURFACE ANTIGEN: Hepatitis B Surface Ag: NONREACTIVE

## 2019-07-07 LAB — HIV ANTIBODY (ROUTINE TESTING W REFLEX): HIV 1&2 Ab, 4th Generation: NONREACTIVE

## 2019-07-07 LAB — RPR: RPR Ser Ql: NONREACTIVE

## 2019-07-08 ENCOUNTER — Encounter: Payer: Self-pay | Admitting: Internal Medicine

## 2019-08-03 ENCOUNTER — Encounter: Payer: Self-pay | Admitting: Family Medicine

## 2019-08-03 ENCOUNTER — Other Ambulatory Visit: Payer: Self-pay

## 2019-08-03 ENCOUNTER — Ambulatory Visit (INDEPENDENT_AMBULATORY_CARE_PROVIDER_SITE_OTHER): Payer: BC Managed Care – PPO | Admitting: Family Medicine

## 2019-08-03 VITALS — BP 116/64 | HR 83 | Temp 98.1°F | Wt 189.4 lb

## 2019-08-03 DIAGNOSIS — F4010 Social phobia, unspecified: Secondary | ICD-10-CM | POA: Diagnosis not present

## 2019-08-03 NOTE — Patient Instructions (Signed)
Increase the Sertraline to one and one half tablet (75 mg) for 1-2 weeks and if no improvement further increase to two tablets daily (100 mg).  If not improved after 2-3 weeks at that dose would taper off .

## 2019-08-03 NOTE — Progress Notes (Signed)
  Subjective:     Patient ID: Ricky Warren, male   DOB: 1976-02-04, 44 y.o.   MRN: [id number]  HPI   Ricky Warren is seen for follow-up regarding his anxiety issues.  Refer to prior note for details.  He does have some social anxiety.  He has frequent symptoms of hyperhidrosis.  He has increased thirst symptoms when he feels nervous.  He had been on Lexapro previously and had some weight gain and did not see improvement with Prozac.  We recently started sertraline and is currently 50 mg daily.  He has not had any side effects but has not seen any real improvement.  Past Medical History:  Diagnosis Date  . Allergy   . Chicken pox   . GERD (gastroesophageal reflux disease)   . Intractable nausea and vomiting - episodic 08/07/2012  . Ulcerative colitis ([place])   . 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 COLONOSCOPY W/ BIOPSIES     multiple  . ESOPHAGOGASTRODUODENOSCOPY     multiple    reports that he quit smoking about 17 years ago. His smoking use included cigarettes. He has a 9.00 pack-year smoking history. He has never used smokeless tobacco. He reports that he does not drink alcohol or use drugs. family history includes Colon cancer in his maternal grandfather and unknown relative; Heart attack in his paternal uncle; Heart disease in his paternal grandfather; Stomach cancer in his maternal grandmother. Allergies  Allergen Reactions  . Ciprofloxacin     unknown  . Doxycycline Nausea Only    GI upset, nausea     Review of Systems  Constitutional: Negative for appetite change and unexpected weight change.  Respiratory: Negative for shortness of breath.   Cardiovascular: Negative for chest pain.  Psychiatric/Behavioral: The patient is nervous/anxious.        Objective:   Physical Exam Vitals reviewed.  Constitutional:      Appearance: Normal appearance.  Cardiovascular:     Rate and Rhythm: Normal rate and regular  rhythm.  Pulmonary:     Effort: Pulmonary effort is normal.     Breath sounds: Normal breath sounds.  Neurological:     Mental Status: He is alert.        Assessment:     Anxiety symptoms with probable social anxiety not much improved on sertraline thus far    Plan:     -Titrate sertraline to 75 mg daily for about 2 weeks and if not improved at that dose try 100 mg.  If not improved at 100 mg he will taper back off and be in touch to let us know  [name] MD [facility] at [facility]

## 2019-08-19 ENCOUNTER — Ambulatory Visit: Payer: BC Managed Care – PPO | Admitting: Internal Medicine

## 2019-08-19 ENCOUNTER — Encounter: Payer: Self-pay | Admitting: Internal Medicine

## 2019-08-19 VITALS — BP 122/74 | HR 77 | Ht 74.0 in | Wt 189.5 lb

## 2019-08-19 DIAGNOSIS — K588 Other irritable bowel syndrome: Secondary | ICD-10-CM

## 2019-08-19 NOTE — Progress Notes (Signed)
   Ricky Warren 44 y.o. 07/04/1975 [id number]  Assessment & Plan:   Encounter Diagnosis  Name Primary?  . Other irritable bowel syndrome Yes    No indication for EGD or colonoscopy at this time.  Plan to schedule a screening colonoscopy at age 35, approximately.  Recall placed.   Subjective:   Chief Complaint: Schedule colonoscopy, EGD  HPI Patient is a 44 year old white man with a history of proctitis that was probably self-limited, many years ago.  See my 2014 note regarding that.  He also had intermittent nausea and vomiting problems.  He is doing well now but thought he was supposed to return for a colonoscopy at the age of 81.  When I saw him in 2014 I had him do an IBD serology panel it was all negative.  Again no symptoms of proctitis, occasional smaller stools but they always returned to normal size. Allergies  Allergen Reactions  . Ciprofloxacin     unknown  . Doxycycline Nausea Only    GI upset, nausea   Current Meds  Medication Sig  . Cranberry 1000 MG CAPS Take by mouth.  . cyclobenzaprine (FLEXERIL) 5 MG tablet Take one to two at night prn muscle spasm  . Multiple Vitamins-Minerals (ADULT GUMMY) CHEW Chew 2 tablets by mouth daily.  . Omega-3 Fatty Acids (FISH OIL) 1000 MG CAPS Take by mouth.  . Probiotic Product (PROBIOTIC-10 PO) Take by mouth.  . sertraline (ZOLOFT) 50 MG tablet Take 1 tablet (50 mg total) by mouth daily.   Past Medical History:  Diagnosis Date  . Allergy   . Chicken pox   . GERD (gastroesophageal reflux disease)   . Intractable nausea and vomiting - episodic 08/07/2012  . Ulcerative colitis ([place])   . 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 COLONOSCOPY W/ BIOPSIES     multiple  . 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)   family history includes  Colon cancer in his maternal grandfather and another family member; Heart attack in his paternal uncle; Heart disease in his paternal grandfather; Stomach cancer in his maternal grandmother.   Review of Systems Anxiety some back pain allergy problems Objective:   Physical Exam BP 122/74   Pulse 77   Ht 6' 2"  (1.88 m)   Wt 189 lb 8 oz (86 kg)   BMI 24.33 kg/m  NAD Anicteric Lungs cta Cor NL abd scaphoid soft NT w/o HSM

## 2019-08-19 NOTE — Patient Instructions (Signed)
We are putting you in our system for a colonoscopy recall for 09/2022.   I appreciate the opportunity to care for you. Silvano Rusk, MD, [facility]

## 2019-09-23 ENCOUNTER — Other Ambulatory Visit: Payer: Self-pay

## 2019-09-23 ENCOUNTER — Ambulatory Visit (INDEPENDENT_AMBULATORY_CARE_PROVIDER_SITE_OTHER): Payer: BC Managed Care – PPO | Admitting: *Deleted

## 2019-09-23 ENCOUNTER — Ambulatory Visit: Payer: BC Managed Care – PPO | Admitting: Family Medicine

## 2019-09-23 DIAGNOSIS — Z23 Encounter for immunization: Secondary | ICD-10-CM | POA: Diagnosis not present

## 2019-09-23 NOTE — Progress Notes (Signed)
Per orders of Dr. Elease Hashimoto, injection of Tdap given by Zacarias Pontes. Patient tolerated injection well.

## 2019-11-07 DIAGNOSIS — Z20822 Contact with and (suspected) exposure to covid-19: Secondary | ICD-10-CM | POA: Diagnosis not present

## 2019-11-24 DIAGNOSIS — Z20822 Contact with and (suspected) exposure to covid-19: Secondary | ICD-10-CM | POA: Diagnosis not present

## 2019-12-08 IMAGING — US US SCROTUM W/ DOPPLER COMPLETE
1 series · 14 of 25 positions shown · non-contrast
Comparison: None.

CLINICAL DATA: Initial evaluation for testicular pain for 7-8
months.

EXAM:
SCROTAL ULTRASOUND
DOPPLER ULTRASOUND OF THE TESTICLES
TECHNIQUE: Complete ultrasound examination of the testicles, epididymis, and
other scrotal structures was performed. Color and spectral Doppler
ultrasound were also utilized to evaluate blood flow to the
testicles.

[Series 1: us scrotum w/ doppler complete · 0.08mm/px · 14 of 54 slices shown]
[im 1/54]
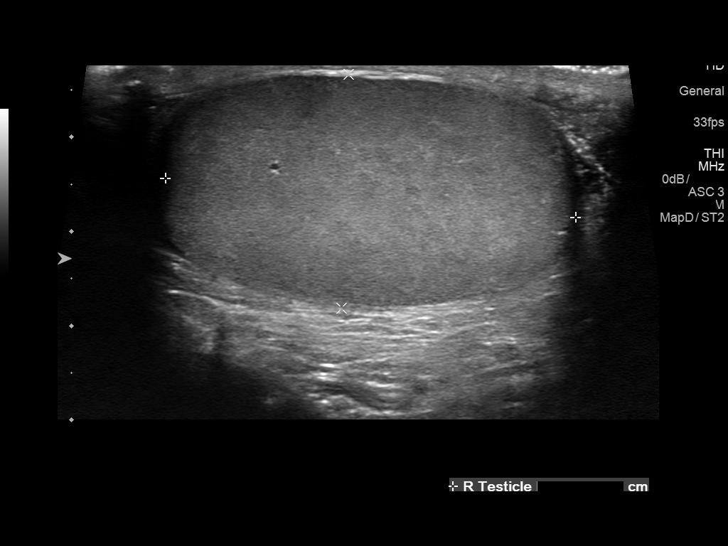
[im 5/54]
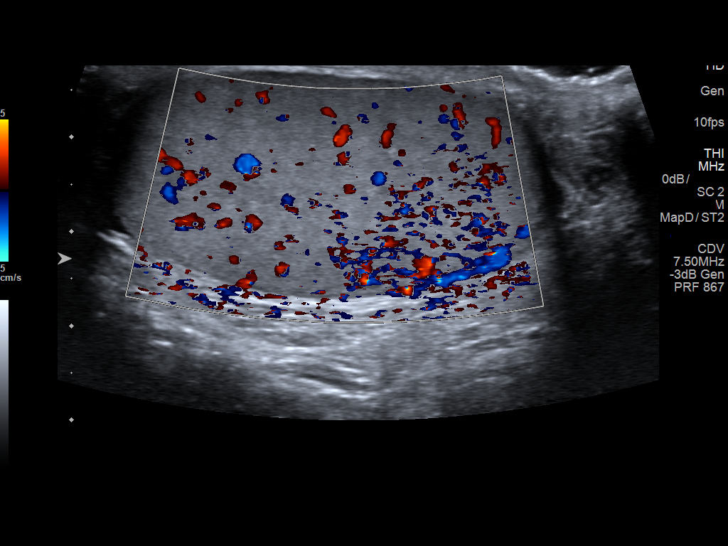
[im 9/54]
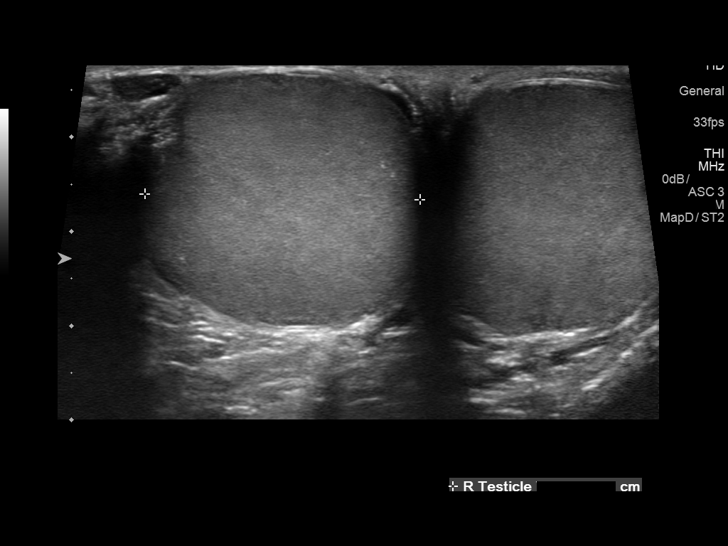
[im 14/54]
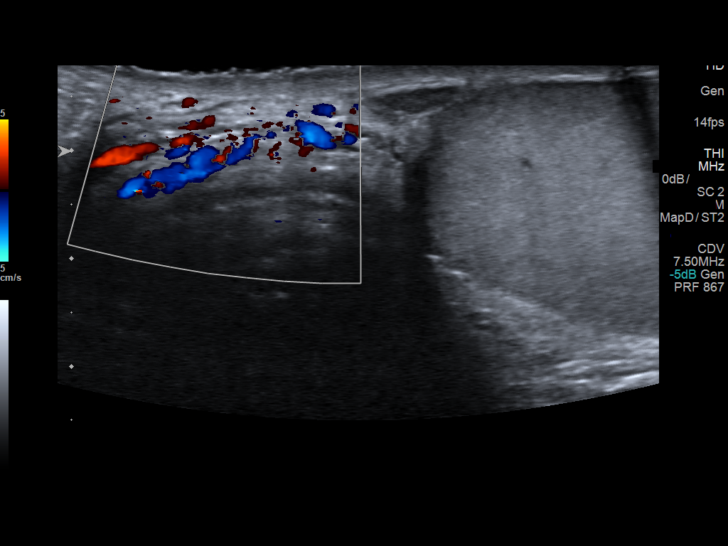
[im 18/54]
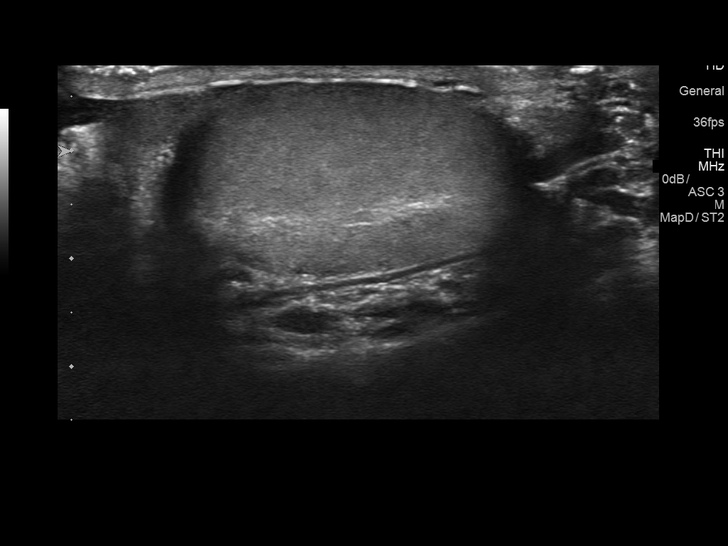
[im 20/54]
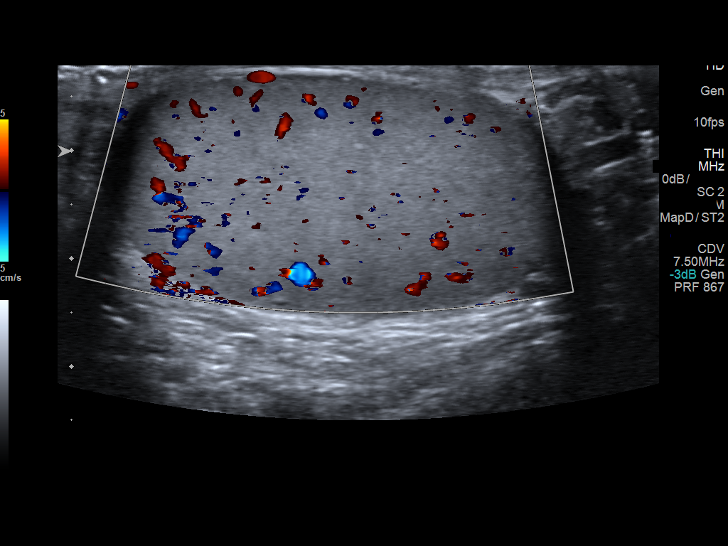
[im 25/54]
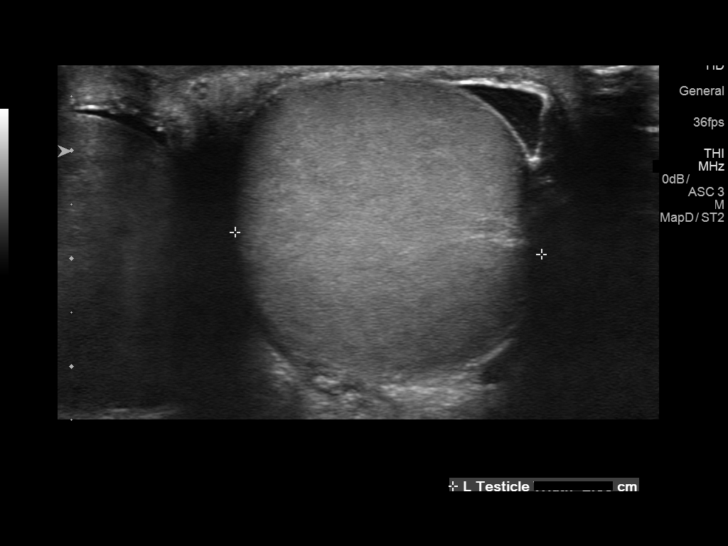
[im 29/54]
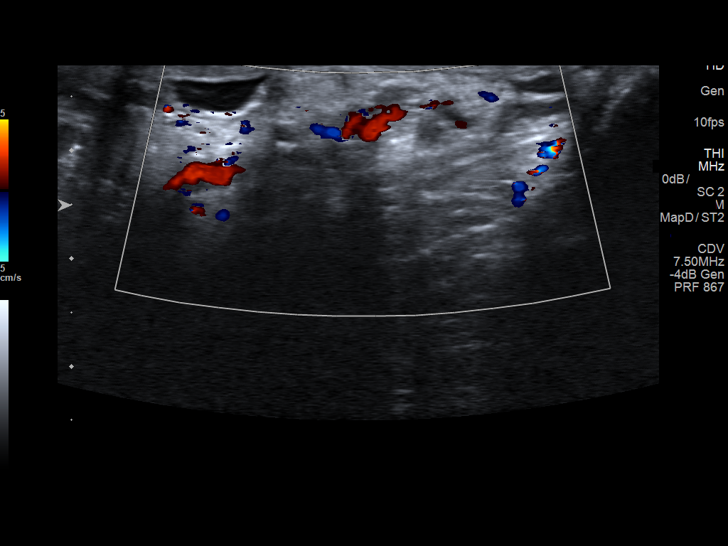
[im 34/54]
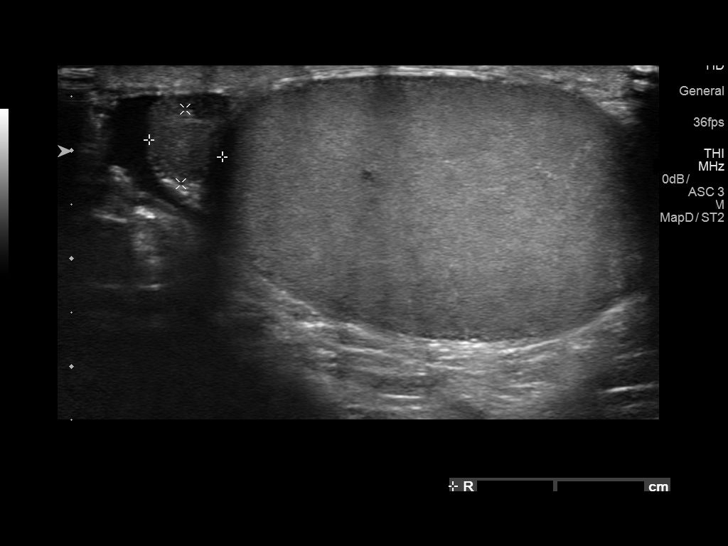
[im 36/54]
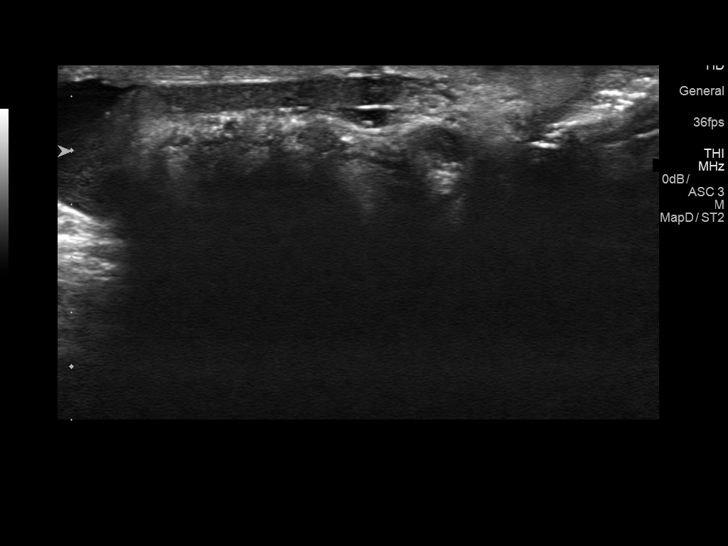
[im 40/54]
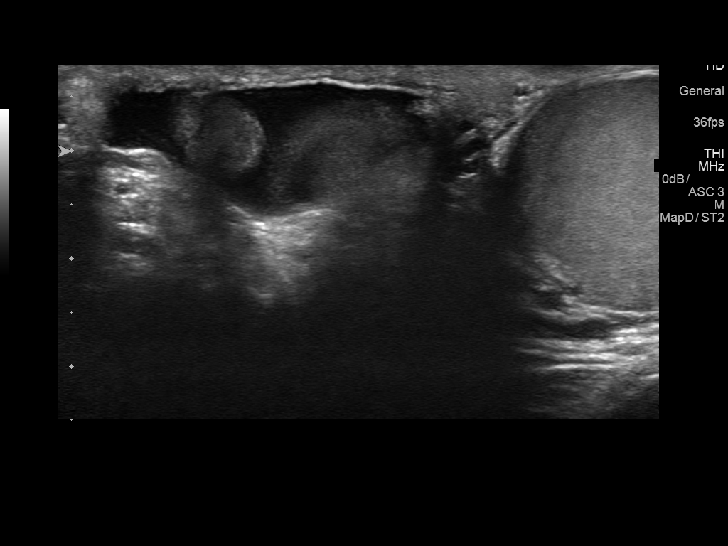
[im 45/54]
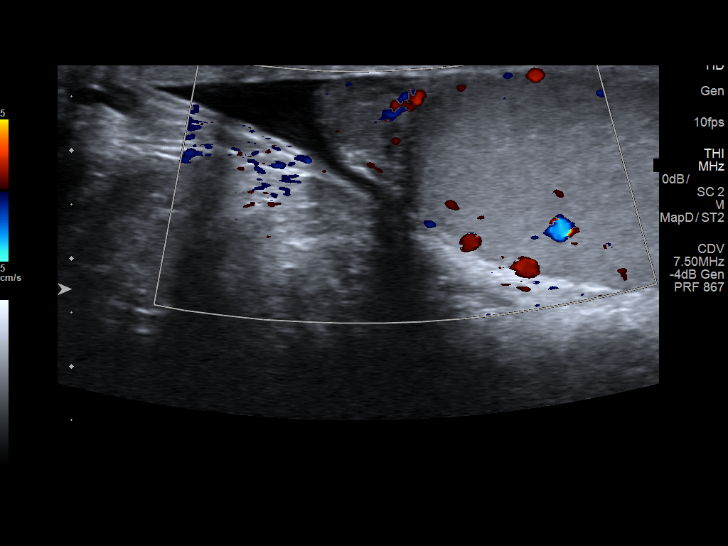
[im 49/54]
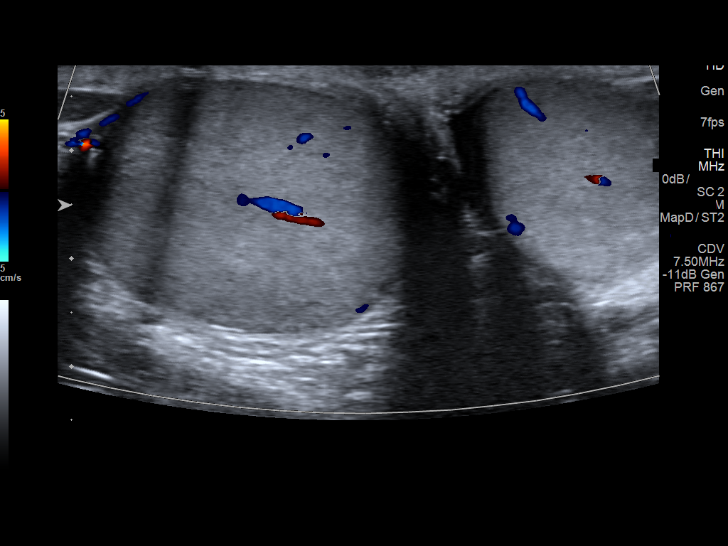
[im 54/54]
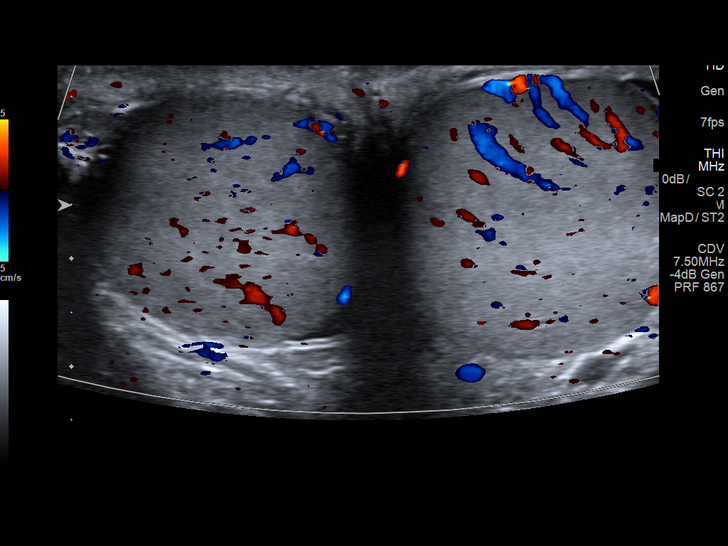

[14 of 25 positions shown; findings below may reference images not displayed]

FINDINGS: Right testicle

Measurements: 4.4 x 2.5 x 2.9 cm. No mass or microlithiasis
visualized.

Left testicle

Measurements: 4.3 x 2.5 x 2.9 cm. No mass or microlithiasis
visualized. Small scrotal pearl measuring 4 x 2 x 5 mm noted.

Right epididymis:  Normal in size and appearance.

Left epididymis:  Normal in size and appearance.

Hydrocele:  None visualized.

Varicocele:  None visualized.

Pulsed Doppler interrogation of both testes demonstrates normal low
resistance arterial and venous waveforms bilaterally.
IMPRESSION: 1. Normal scrotal ultrasound. No mass lesion or other acute
abnormality identified.
2. Incidental 5 mm left scrotal pearl.

## 2020-07-05 ENCOUNTER — Telehealth: Payer: Self-pay | Admitting: Family Medicine

## 2020-07-05 NOTE — Telephone Encounter (Signed)
Pt needs to schedule yearly cpe for anytime.

## 2020-08-27 ENCOUNTER — Other Ambulatory Visit: Payer: Self-pay

## 2020-08-27 ENCOUNTER — Encounter: Payer: Self-pay | Admitting: Family Medicine

## 2020-08-27 ENCOUNTER — Ambulatory Visit (INDEPENDENT_AMBULATORY_CARE_PROVIDER_SITE_OTHER): Payer: BC Managed Care – PPO | Admitting: Family Medicine

## 2020-08-27 VITALS — BP 132/72 | HR 84 | Temp 98.2°F | Ht 74.0 in | Wt 199.9 lb

## 2020-08-27 DIAGNOSIS — Z0000 Encounter for general adult medical examination without abnormal findings: Secondary | ICD-10-CM

## 2020-08-27 LAB — BASIC METABOLIC PANEL
BUN: 11 mg/dL (ref 6–23)
CO2: 27 mEq/L (ref 19–32)
Calcium: 9.3 mg/dL (ref 8.4–10.5)
Chloride: 103 mEq/L (ref 96–112)
Creatinine, Ser: 0.86 mg/dL (ref 0.40–1.50)
GFR: 106.5 mL/min (ref >60.00)
Glucose, Bld: 96 mg/dL (ref 70–99)
Potassium: 4.1 mEq/L (ref 3.5–5.1)
Sodium: 138 mEq/L (ref 135–145)

## 2020-08-27 LAB — CBC WITH DIFFERENTIAL/PLATELET
Basophils Absolute: 0 10*3/uL (ref 0.0–0.1)
Basophils Relative: 0.7 % (ref 0.0–3.0)
Eosinophils Absolute: 0.1 10*3/uL (ref 0.0–0.7)
Eosinophils Relative: 2.8 % (ref 0.0–5.0)
HCT: 42.4 % (ref 39.0–52.0)
Hemoglobin: 14.5 g/dL (ref 13.0–17.0)
Lymphocytes Relative: 40.6 % (ref 12.0–46.0)
Lymphs Abs: 1.8 10*3/uL (ref 0.7–4.0)
MCHC: 34.3 g/dL (ref 30.0–36.0)
MCV: 89 fl (ref 78.0–100.0)
Monocytes Absolute: 0.3 10*3/uL (ref 0.1–1.0)
Monocytes Relative: 6 % (ref 3.0–12.0)
Neutro Abs: 2.2 10*3/uL (ref 1.4–7.7)
Neutrophils Relative %: 49.9 % (ref 43.0–77.0)
Platelets: 171 10*3/uL (ref 150.0–400.0)
RBC: 4.76 Mil/uL (ref 4.22–5.81)
RDW: 13.1 % (ref 11.5–15.5)
WBC: 4.5 10*3/uL (ref 4.0–10.5)

## 2020-08-27 LAB — LIPID PANEL
Cholesterol: 151 mg/dL (ref 0–200)
HDL: 56 mg/dL (ref >39.00)
LDL Cholesterol: 87 mg/dL (ref 0–99)
NonHDL: 95.35
Total CHOL/HDL Ratio: 3
Triglycerides: 41 mg/dL (ref 0.0–149.0)
VLDL: 8.2 mg/dL (ref 0.0–40.0)

## 2020-08-27 LAB — HEPATIC FUNCTION PANEL
ALT: 18 U/L (ref 0–53)
AST: 22 U/L (ref 0–37)
Albumin: 4.7 g/dL (ref 3.5–5.2)
Alkaline Phosphatase: 56 U/L (ref 39–117)
Bilirubin, Direct: 0.2 mg/dL (ref 0.0–0.3)
Total Bilirubin: 0.8 mg/dL (ref 0.2–1.2)
Total Protein: 7.3 g/dL (ref 6.0–8.3)

## 2020-08-27 LAB — TSH: TSH: 0.84 u[IU]/mL (ref 0.35–4.50)

## 2020-08-27 NOTE — Progress Notes (Signed)
Established Patient Office Visit  Subjective:  Patient ID: Ricky Warren, male    DOB: 17-Jun-1975  Age: 45 y.o. MRN: [id number]  CC:  Chief Complaint  Patient presents with   Annual Exam    No new concerns    HPI Ricky Warren presents for physical exam.  He has history of ulcerative proctosigmoiditis but no recent GI issues.  He had some anxiety issues in the past but currently stable without medication.  No history of hepatitis C screening but low risk.  His tetanus is up-to-date.  He has had COVID vaccines but does not have card with him today for dates.  Family history reviewed.  Only changes that his father was diagnosed with Parkinson's disease.  Social history-he is single.  Smoked from age [age].  Rare alcohol use.  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 GF, possible    Social History   Socioeconomic History   Marital status: Single    Spouse name: Not on file   Number of children: Not on file   Years of education: Not on file   Highest education level: Not on file  Occupational History   Not on file  Tobacco Use   Smoking status: Former    Packs/day: 1.00    Years: 9.00    Pack years: 9.00    Types: Cigarettes    Quit date:  10/21/2001    Years since quitting: 18.8   Smokeless tobacco: Never  Vaping Use   Vaping Use: Never used  Substance and Sexual Activity   Alcohol use: No   Drug use: No   Sexual activity: Never  Other Topics Concern   Not on file  Social History Narrative   Single no children   Musician, 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)   Social Determinants of Health   Financial Resource Strain: Not on file  Food Insecurity: Not on file  Transportation Needs: Not on file  Physical Activity: Not on file  Stress: Not on file  Social Connections: Not on file  Intimate Partner Violence: Not on file    Outpatient Medications Prior to Visit  Medication Sig Dispense Refill   Cranberry 1000 MG CAPS Take by mouth.     Omega-3 Fatty Acids (FISH OIL) 1000 MG CAPS Take by mouth.     Probiotic Product (PROBIOTIC-10 PO) Take by mouth.     cyclobenzaprine (FLEXERIL) 5 MG tablet Take one to two at night prn muscle spasm 30 tablet 2   Multiple Vitamins-Minerals (ADULT GUMMY) CHEW Chew 2 tablets by mouth daily.     sertraline (  ZOLOFT) 50 MG tablet Take 1 tablet (50 mg total) by mouth daily. (Patient taking differently: Take 100 mg by mouth daily. ) 30 tablet 3   No facility-administered medications prior to visit.    Allergies  Allergen Reactions   Ciprofloxacin     unknown   Doxycycline Nausea Only    GI upset, nausea    ROS Review of Systems  Constitutional:  Negative for activity change, appetite change, fatigue and fever.  HENT:  Negative for congestion, ear pain and trouble swallowing.   Eyes:  Negative for pain and visual disturbance.  Respiratory:  Negative for cough, shortness of breath and wheezing.   Cardiovascular:  Negative for chest pain and palpitations.  Gastrointestinal:  Negative for abdominal distention, abdominal pain, blood in stool, constipation, diarrhea, nausea, rectal pain and vomiting.  Genitourinary:  Negative for dysuria, hematuria and testicular pain.   Musculoskeletal:  Negative for arthralgias and joint swelling.  Skin:  Negative for rash.  Neurological:  Negative for dizziness, syncope and headaches.  Hematological:  Negative for adenopathy.  Psychiatric/Behavioral:  Negative for confusion and dysphoric mood.      Objective:    Physical Exam Constitutional:      General: He is not in acute distress.    Appearance: He is well-developed.  HENT:     Head: Normocephalic and atraumatic.     Right Ear: External ear normal.     Left Ear: External ear normal.  Eyes:     Conjunctiva/sclera: Conjunctivae normal.     Pupils: Pupils are equal, round, and reactive to light.  Neck:     Thyroid: No thyromegaly.  Cardiovascular:     Rate and Rhythm: Normal rate and regular rhythm.     Heart sounds: Normal heart sounds. No murmur heard. Pulmonary:     Effort: No respiratory distress.     Breath sounds: No wheezing or rales.  Abdominal:     General: Bowel sounds are normal. There is no distension.     Palpations: Abdomen is soft. There is no mass.     Tenderness: There is no abdominal tenderness. There is no guarding or rebound.  Musculoskeletal:     Cervical back: Normal range of motion and neck supple.  Lymphadenopathy:     Cervical: No cervical adenopathy.  Skin:    Findings: No rash.  Neurological:     Mental Status: He is alert and oriented to person, place, and time.     Cranial Nerves: No cranial nerve deficit.     Deep Tendon Reflexes: Reflexes normal.    BP 132/72 (BP Location: Left Arm, Patient Position: Sitting, Cuff Size: Normal)   Pulse 84   Temp 98.2 F (36.8 C) (Oral)   Ht 6' 2"  (1.88 m)   Wt 199 lb 14.4 oz (90.7 kg)   SpO2 97%   BMI 25.67 kg/m  Wt Readings from Last 3 Encounters:  08/27/20 199 lb 14.4 oz (90.7 kg)  08/19/19 189 lb 8 oz (86 kg)  08/03/19 189 lb 6.4 oz (85.9 kg)     Health Maintenance Due  Topic Date Due   COVID-19 Vaccine (1) Never done   Hepatitis C Screening  Never done    There  are no preventive care reminders to display for this patient.  Lab Results  Component Value Date   TSH 0.475 03/07/2013   Lab Results  Component Value Date   WBC 7.5 12/30/2017   HGB 14.1 12/30/2017   HCT 41.4 12/30/2017   MCV  88.7 12/30/2017   PLT 285.0 12/30/2017   Lab Results  Component Value Date   NA 138 12/30/2017   K 4.6 12/30/2017   CO2 30 12/30/2017   GLUCOSE 87 12/30/2017   BUN 10 12/30/2017   CREATININE 0.88 12/30/2017   BILITOT 0.4 12/30/2017   ALKPHOS 75 12/30/2017   AST 25 12/30/2017   ALT 22 12/30/2017   PROT 7.3 12/30/2017   ALBUMIN 4.2 12/30/2017   CALCIUM 9.4 12/30/2017   GFR 101.82 12/30/2017   No results found for: CHOL No results found for: HDL No results found for: LDLCALC No results found for: TRIG No results found for: CHOLHDL No results found for: HGBA1C    Assessment & Plan:   Problem List Items Addressed This Visit   None Visit Diagnoses     Physical exam    -  Primary   Relevant Orders   Basic metabolic panel   Lipid panel   CBC with Differential/Platelet   TSH   Hepatic function panel   Hepatitis C antibody     Generally healthy 45 year old male. -Recommend screening labs as above.  Include hepatitis C antibody. -Recommend minimum 150 minutes/week of moderate aerobic exercise such as walking  No orders of the defined types were placed in this encounter.   Follow-up: No follow-ups on file.    Carolann Littler, MD

## 2020-08-28 LAB — HEPATITIS C ANTIBODY
Hepatitis C Ab: NONREACTIVE
SIGNAL TO CUT-OFF: 0.01 (ref <1.00)

## 2020-12-31 ENCOUNTER — Other Ambulatory Visit: Payer: Self-pay

## 2020-12-31 ENCOUNTER — Ambulatory Visit (INDEPENDENT_AMBULATORY_CARE_PROVIDER_SITE_OTHER): Payer: BC Managed Care – PPO

## 2020-12-31 DIAGNOSIS — Z23 Encounter for immunization: Secondary | ICD-10-CM | POA: Diagnosis not present

## 2022-05-19 ENCOUNTER — Ambulatory Visit (INDEPENDENT_AMBULATORY_CARE_PROVIDER_SITE_OTHER): Payer: BLUE CROSS/BLUE SHIELD | Admitting: Family Medicine

## 2022-05-19 ENCOUNTER — Other Ambulatory Visit (HOSPITAL_COMMUNITY)
Admission: RE | Admit: 2022-05-19 | Discharge: 2022-05-19 | Disposition: A | Discharge status: Home / Self Care | Payer: BLUE CROSS/BLUE SHIELD | Source: Ambulatory Visit | Attending: Family Medicine | Admitting: Family Medicine

## 2022-05-19 VITALS — BP 130/78 | HR 91 | Temp 98.2°F | Ht 73.23 in | Wt 184.7 lb

## 2022-05-19 DIAGNOSIS — M79675 Pain in left toe(s): Secondary | ICD-10-CM | POA: Diagnosis not present

## 2022-05-19 DIAGNOSIS — Z0000 Encounter for general adult medical examination without abnormal findings: Secondary | ICD-10-CM

## 2022-05-19 DIAGNOSIS — Z113 Encounter for screening for infections with a predominantly sexual mode of transmission: Secondary | ICD-10-CM

## 2022-05-19 LAB — HEPATIC FUNCTION PANEL
ALT: 24 U/L (ref 0–53)
AST: 25 U/L (ref 0–37)
Albumin: 4.2 g/dL (ref 3.5–5.2)
Alkaline Phosphatase: 67 U/L (ref 39–117)
Bilirubin, Direct: 0.2 mg/dL (ref 0.0–0.3)
Total Bilirubin: 0.7 mg/dL (ref 0.2–1.2)
Total Protein: 7.1 g/dL (ref 6.0–8.3)

## 2022-05-19 LAB — CBC WITH DIFFERENTIAL/PLATELET
Basophils Absolute: 0 10*3/uL (ref 0.0–0.1)
Basophils Relative: 0.7 % (ref 0.0–3.0)
Eosinophils Absolute: 0.1 10*3/uL (ref 0.0–0.7)
Eosinophils Relative: 1.8 % (ref 0.0–5.0)
HCT: 44.9 % (ref 39.0–52.0)
Hemoglobin: 15.3 g/dL (ref 13.0–17.0)
Lymphocytes Relative: 38.2 % (ref 12.0–46.0)
Lymphs Abs: 1.6 10*3/uL (ref 0.7–4.0)
MCHC: 34.1 g/dL (ref 30.0–36.0)
MCV: 90.1 fl (ref 78.0–100.0)
Monocytes Absolute: 0.3 10*3/uL (ref 0.1–1.0)
Monocytes Relative: 6.5 % (ref 3.0–12.0)
Neutro Abs: 2.2 10*3/uL (ref 1.4–7.7)
Neutrophils Relative %: 52.8 % (ref 43.0–77.0)
Platelets: 207 10*3/uL (ref 150.0–400.0)
RBC: 4.98 Mil/uL (ref 4.22–5.81)
RDW: 12.7 % (ref 11.5–15.5)
WBC: 4.1 10*3/uL (ref 4.0–10.5)

## 2022-05-19 LAB — BASIC METABOLIC PANEL
BUN: 11 mg/dL (ref 6–23)
CO2: 28 mEq/L (ref 19–32)
Calcium: 9.3 mg/dL (ref 8.4–10.5)
Chloride: 103 mEq/L (ref 96–112)
Creatinine, Ser: 1.04 mg/dL (ref 0.40–1.50)
GFR: 87.26 mL/min (ref >60.00)
Glucose, Bld: 103 mg/dL — ABNORMAL HIGH (ref 70–99)
Potassium: 3.9 mEq/L (ref 3.5–5.1)
Sodium: 137 mEq/L (ref 135–145)

## 2022-05-19 LAB — LIPID PANEL
Cholesterol: 145 mg/dL (ref 0–200)
HDL: 59.7 mg/dL (ref >39.00)
LDL Cholesterol: 76 mg/dL (ref 0–99)
NonHDL: 85.47
Total CHOL/HDL Ratio: 2
Triglycerides: 46 mg/dL (ref 0.0–149.0)
VLDL: 9.2 mg/dL (ref 0.0–40.0)

## 2022-05-19 NOTE — Progress Notes (Signed)
Established Patient Office Visit  Subjective   Patient ID: Ricky Warren, male    DOB: July 07, 1975  Age: 47 y.o. MRN: [id number]  Chief Complaint  Patient presents with   Annual Exam    HPI   Ricky Warren is seen for physical exam.  Doing generally well.  He has been spending quite a bit of time up at [facility] helping to care for his elderly parents.  Does exercise fairly frequently with stationary bike and walking.  He has had for over a year now all some pains involving the left fourth toe.  This was first noted after doing stairstepper has had some persistent pain since then.  Health maintenance reviewed  -Tetanus up-to-date -Prior hepatitis C screen negative -Will be due for consideration for repeat colonoscopy at age 38.   Family history-both parents are alive.  His father has Parkinson's disease.  He has a brother with hypertension.  Social history-he is single.  Smoked briefly from his teens to early 72s.  No regular alcohol.  Has had multiple sexual partners in the past.  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 His smoking use included cigarettes. He has a 9.00 pack-year smoking history. He has never used smokeless tobacco. He reports that he does not drink alcohol and does not use drugs. family history includes Colon cancer in his maternal grandfather and another family member; Heart attack in his paternal uncle; Heart disease in his paternal grandfather; Hypertension in his brother; Parkinson's disease in his father; Stomach cancer in his maternal grandmother. Allergies  Allergen  Reactions   Ciprofloxacin     unknown   Doxycycline Nausea Only    GI upset, nausea     Review of Systems  Constitutional:  Negative for chills, fever, malaise/fatigue and weight loss.  HENT:  Negative for hearing loss.   Eyes:  Negative for blurred vision and double vision.  Respiratory:  Negative for cough and shortness of breath.   Cardiovascular:  Negative for chest pain, palpitations and leg swelling.  Gastrointestinal:  Negative for abdominal pain, blood in stool, constipation and diarrhea.  Genitourinary:  Negative for dysuria.  Skin:  Negative for rash.  Neurological:  Negative for dizziness, speech change, seizures, loss of consciousness and headaches.  Psychiatric/Behavioral:  Negative for depression.       Objective:     BP 130/78 (BP Location: Left Arm, Patient Position: Sitting, Cuff Size: Normal)   Pulse 91   Temp 98.2 F (36.8 C) (Oral)   Ht 6' 1.23" (1.86 m)   Wt 184 lb 11.2 oz (83.8 kg)   SpO2 98%   BMI 24.22 kg/m  BP Readings from Last 3 Encounters:  05/19/22 130/78  08/27/20 132/72  08/19/19 122/74   Wt Readings from Last 3 Encounters:  05/19/22 184 lb 11.2 oz (83.8 kg)  08/27/20 199 lb 14.4 oz (90.7 kg)  08/19/19 189 lb 8 oz (86 kg)      Physical Exam Vitals reviewed.  Constitutional:      General: He is not in acute distress.  Appearance: He is well-developed.  HENT:     Head: Normocephalic and atraumatic.     Right Ear: External ear normal.     Left Ear: External ear normal.  Eyes:     Conjunctiva/sclera: Conjunctivae normal.     Pupils: Pupils are equal, round, and reactive to light.  Neck:     Thyroid: No thyromegaly.  Cardiovascular:     Rate and Rhythm: Normal rate and regular rhythm.     Heart sounds: Normal heart sounds. No murmur heard. Pulmonary:     Effort: No respiratory distress.     Breath sounds: No wheezing or rales.  Abdominal:     General: Bowel sounds are normal. There is no distension.     Palpations:  Abdomen is soft. There is no mass.     Tenderness: There is no abdominal tenderness. There is no guarding or rebound.  Musculoskeletal:     Cervical back: Normal range of motion and neck supple.     Right lower leg: No edema.     Left lower leg: No edema.     Comments: Left fourth toe reveals no localized tenderness.  No erythema.  No visible swelling.  Lymphadenopathy:     Cervical: No cervical adenopathy.  Skin:    Findings: No rash.  Neurological:     Mental Status: He is alert and oriented to person, place, and time.     Cranial Nerves: No cranial nerve deficit.     Deep Tendon Reflexes: Reflexes normal.      No results found for any visits on 05/19/22.    The 10-year ASCVD risk score (Arnett DK, et al., 2019) is: 1%    Assessment & Plan:   Problem List Items Addressed This Visit   None Visit Diagnoses     Physical exam    -  Primary   Relevant Orders   Lipid panel   Basic metabolic panel   CBC with Differential/Platelet   Hepatic function panel   Screen for STD (sexually transmitted disease)       Relevant Orders   HIV antibody (with reflex)   RPR   Urine cytology ancillary only     We discussed the following health maintenance items  -Recommend he consider follow-up colonoscopy age 68 -Continue annual flu vaccine -Continue regular exercise habits -Patient requesting STD screening.  He is asymptomatic.  We also discussed possible HPV vaccine.  He is considering at pharmacy. -Obtain follow-up screening labs as above  No follow-ups on file.    Ricky Littler, MD

## 2022-05-19 NOTE — Patient Instructions (Signed)
Let me if interested in getting follow up colonoscopy at age 47.

## 2022-05-20 LAB — RPR: RPR Ser Ql: NONREACTIVE

## 2022-05-20 LAB — URINE CYTOLOGY ANCILLARY ONLY
Chlamydia: NEGATIVE
Comment: NEGATIVE
Comment: NEGATIVE
Comment: NORMAL
Neisseria Gonorrhea: NEGATIVE
Trichomonas: NEGATIVE

## 2022-05-20 LAB — HIV ANTIBODY (ROUTINE TESTING W REFLEX): HIV 1&2 Ab, 4th Generation: NONREACTIVE

## 2022-05-29 ENCOUNTER — Ambulatory Visit: Payer: BLUE CROSS/BLUE SHIELD | Admitting: Podiatry

## 2023-04-05 ENCOUNTER — Encounter: Payer: Self-pay | Admitting: Internal Medicine

## 2024-02-23 ENCOUNTER — Ambulatory Visit: Admitting: Family Medicine

## 2024-02-23 ENCOUNTER — Encounter: Payer: Self-pay | Admitting: Family Medicine

## 2024-02-23 ENCOUNTER — Ambulatory Visit: Payer: Self-pay | Admitting: Family Medicine

## 2024-02-23 VITALS — BP 110/70 | HR 84 | Temp 98.6°F | Ht 74.02 in | Wt 182.2 lb

## 2024-02-23 DIAGNOSIS — Z0000 Encounter for general adult medical examination without abnormal findings: Secondary | ICD-10-CM

## 2024-02-23 LAB — BASIC METABOLIC PANEL WITH GFR
BUN: 18 mg/dL (ref 6–23)
CO2: 30 meq/L (ref 19–32)
Calcium: 9.8 mg/dL (ref 8.4–10.5)
Chloride: 99 meq/L (ref 96–112)
Creatinine, Ser: 0.93 mg/dL (ref 0.40–1.50)
GFR: 98.55 mL/min (ref >60.00)
Glucose, Bld: 96 mg/dL (ref 70–99)
Potassium: 4.1 meq/L (ref 3.5–5.1)
Sodium: 135 meq/L (ref 135–145)

## 2024-02-23 LAB — CBC WITH DIFFERENTIAL/PLATELET
Basophils Absolute: 0 K/uL (ref 0.0–0.1)
Basophils Relative: 0.7 % (ref 0.0–3.0)
Eosinophils Absolute: 0.1 K/uL (ref 0.0–0.7)
Eosinophils Relative: 2.4 % (ref 0.0–5.0)
HCT: 44.6 % (ref 39.0–52.0)
Hemoglobin: 15.5 g/dL (ref 13.0–17.0)
Lymphocytes Relative: 31.6 % (ref 12.0–46.0)
Lymphs Abs: 1.8 K/uL (ref 0.7–4.0)
MCHC: 34.8 g/dL (ref 30.0–36.0)
MCV: 89.4 fl (ref 78.0–100.0)
Monocytes Absolute: 0.3 K/uL (ref 0.1–1.0)
Monocytes Relative: 6.1 % (ref 3.0–12.0)
Neutro Abs: 3.4 K/uL (ref 1.4–7.7)
Neutrophils Relative %: 59.2 % (ref 43.0–77.0)
Platelets: 181 K/uL (ref 150.0–400.0)
RBC: 5 Mil/uL (ref 4.22–5.81)
RDW: 12.9 % (ref 11.5–15.5)
WBC: 5.7 K/uL (ref 4.0–10.5)

## 2024-02-23 LAB — HEMOGLOBIN A1C: Hgb A1c MFr Bld: 5.7 % (ref 4.6–6.5)

## 2024-02-23 LAB — HEPATIC FUNCTION PANEL
ALT: 18 U/L (ref 3–53)
AST: 20 U/L (ref 5–37)
Albumin: 4.6 g/dL (ref 3.5–5.2)
Alkaline Phosphatase: 76 U/L (ref 39–117)
Bilirubin, Direct: 0.1 mg/dL (ref 0.1–0.3)
Total Bilirubin: 0.5 mg/dL (ref 0.2–1.2)
Total Protein: 7.9 g/dL (ref 6.0–8.3)

## 2024-02-23 LAB — LIPID PANEL
Cholesterol: 145 mg/dL (ref 28–200)
HDL: 70.2 mg/dL (ref >39.00)
LDL Cholesterol: 64 mg/dL (ref 10–99)
NonHDL: 74.68
Total CHOL/HDL Ratio: 2
Triglycerides: 52 mg/dL (ref 10.0–149.0)
VLDL: 10.4 mg/dL (ref 0.0–40.0)

## 2024-02-23 NOTE — Progress Notes (Signed)
 Established Patient Office Visit  Subjective   Patient ID: Ricky Warren, male    DOB: Sep 12, 1975  Age: 48 y.o. MRN: [id number]  Chief Complaint  Patient presents with   Annual Exam    HPI    Karleen is seen today for physical exam.  He has history of social anxiety and past history of ulcerative proctosigmoiditis.  For years he took anti-inflammatory medication and was taken off by GI several years ago and symptoms have been stable.  He is due for repeat colonoscopy at this time.  Generally doing well.  No recent GI symptoms.  His parents live at [facility] and he helps take care of them.  His father has Parkinson's disease.  He also works as a contractor.  Health maintenance reviewed:  Health Maintenance  Topic Date Due   Hepatitis B Vaccines 19-59 Average Risk (2 of 3 - 19+ 3-dose series) 08/12/2022   Colonoscopy  10/02/2022   DTaP/Tdap/Td (3 - Td or Tdap) 09/22/2029   Influenza Vaccine  Completed   COVID-19 Vaccine  Completed   Hepatitis C Screening  Completed   HIV Screening  Completed   Pneumococcal Vaccine  Aged Out   HPV VACCINES  Aged Out   Meningococcal B Vaccine  Aged Out   - Is overdue for repeat colonoscopy.  Willing to set up at this time  Family history-father has Parkinson's disease.  His mom is generally healthy.  Brother with hypertension.  Social history-single.  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  Smoked briefly teens to early 49s.  Infrequent alcohol use.  Past Medical History:  Diagnosis Date   Allergy    Chicken pox    GERD (gastroesophageal reflux disease)    Intractable nausea and vomiting - episodic 08/07/2012   Ulcerative colitis ([facility])    Ulcerative proctosigmoiditis ([facility])    Past Surgical History:  Procedure Laterality Date   COLONOSCOPY W/ BIOPSIES     multiple   ESOPHAGOGASTRODUODENOSCOPY     multiple    reports that he quit smoking about 22 years ago. His  smoking use included cigarettes. He started smoking about 31 years ago. He has a 9 pack-year smoking history. He has never used smokeless tobacco. He reports that he does not drink alcohol and does not use drugs. family history includes Colon cancer in his maternal grandfather and another family member; Heart attack in his paternal uncle; Heart disease in his paternal grandfather; Hypertension in his brother; Parkinson's disease in his father; Stomach cancer in his maternal grandmother. Allergies[1]  Review of Systems  Constitutional:  Negative for chills, fever, malaise/fatigue and weight loss.  HENT:  Negative for hearing loss.   Eyes:  Negative for blurred vision and double vision.  Respiratory:  Negative for cough and shortness of breath.   Cardiovascular:  Negative for chest pain, palpitations and leg swelling.  Gastrointestinal:  Negative for abdominal pain, blood in stool, constipation and diarrhea.  Genitourinary:  Negative for dysuria.  Skin:  Negative for rash.  Neurological:  Negative for dizziness, speech change, seizures, loss of consciousness and headaches.  Psychiatric/Behavioral:  Negative for depression.       Objective:     BP 110/70   Pulse 84   Temp 98.6 F (37 C) (Oral)   Ht 6' 2.02 (1.88 m)   Wt 182 lb 3.2 oz (82.6 kg)   SpO2 99%   BMI 23.38 kg/m  BP Readings from Last  3 Encounters:  02/23/24 110/70  05/19/22 130/78  08/27/20 132/72   Wt Readings from Last 3 Encounters:  02/23/24 182 lb 3.2 oz (82.6 kg)  05/19/22 184 lb 11.2 oz (83.8 kg)  08/27/20 199 lb 14.4 oz (90.7 kg)      Physical Exam Vitals reviewed.  Constitutional:      General: He is not in acute distress.    Appearance: He is well-developed. He is not ill-appearing.  HENT:     Head: Normocephalic and atraumatic.     Right Ear: External ear normal.     Left Ear: External ear normal.  Eyes:     Conjunctiva/sclera: Conjunctivae normal.     Pupils: Pupils are equal, round, and  reactive to light.  Neck:     Thyroid: No thyromegaly.  Cardiovascular:     Rate and Rhythm: Normal rate and regular rhythm.     Heart sounds: Normal heart sounds. No murmur heard. Pulmonary:     Effort: No respiratory distress.     Breath sounds: No wheezing or rales.  Abdominal:     General: Bowel sounds are normal. There is no distension.     Palpations: Abdomen is soft. There is no mass.     Tenderness: There is no abdominal tenderness. There is no guarding or rebound.  Musculoskeletal:     Cervical back: Normal range of motion and neck supple.     Right lower leg: No edema.     Left lower leg: No edema.  Lymphadenopathy:     Cervical: No cervical adenopathy.  Skin:    Findings: No rash.  Neurological:     Mental Status: He is alert and oriented to person, place, and time.     Cranial Nerves: No cranial nerve deficit.      No results found for any visits on 02/23/24.    The 10-year ASCVD risk score (Arnett DK, et al., 2019) is: 0.8%    Assessment & Plan:   Problem List Items Addressed This Visit   None Visit Diagnoses       Physical exam    -  Primary   Relevant Orders   Basic metabolic panel with GFR   Lipid panel   CBC with Differential/Platelet   Hepatic function panel   Hemoglobin A1c   Ambulatory referral to Gastroenterology     Generally healthy 48 year old male.  No active chronic medical problems.  We discussed the following health maintenance items  -Set up repeat colonoscopy.  He prefers to do this after January 7 - Immunizations up-to-date - Obtain follow-up labs as above.  Including A1c as he has had several fasting mildly elevated glucose levels in the past. - Recommend minimum of 150 minutes/week of moderate intensity exercise such as brisk walking  No follow-ups on file.    Wolm Scarlet, MD     [1]  Allergies Allergen Reactions   Ciprofloxacin     unknown   Doxycycline  Nausea Only    GI upset, nausea

## 2024-04-15 ENCOUNTER — Encounter: Payer: Self-pay | Admitting: Internal Medicine
